# Patient Record
Sex: Male | Born: 1990 | Race: Black or African American | Hispanic: No | Marital: Single | Smoking: Former smoker
Health system: Southern US, Community
[De-identification: ages and names within clinical notes are randomized; demographics above are authoritative.]

## PROBLEM LIST (undated history)

## (undated) DIAGNOSIS — J45909 Unspecified asthma, uncomplicated: Secondary | ICD-10-CM

## (undated) HISTORY — PX: ANKLE SURGERY: SHX546

---
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
## 2010-09-28 NOTE — Op Note (Signed)
NAME:  Christopher Hicks, Christopher Hicks                    ACCOUNT NO.:  [id number]   MEDICAL RECORD NO.:  [id number]                   PATIENT TYPE:  OIB   LOCATION:  6123                                 FACILITY:  [facility]   PHYSICIAN:  Dionne Ano. Everlene Other, M.D.         DATE OF BIRTH:  [birth year]   DATE OF PROCEDURE:  03/09/2002  DATE OF DISCHARGE:                                 OPERATIVE REPORT   PREOPERATIVE DIAGNOSIS:  Malunion, right radius with pain and deformity.   POSTOPERATIVE DIAGNOSIS:  Malunion, right radius with pain and deformity.   PROCEDURE:  1. Corrective osteotomy, right radius with autologous bone grafting and     dorsal plate fixation.  2. Decompression/incision of  the extensor pollicis longus tendon sheath     (third dorsal compartment tendon sheath incision).  3. Stress radiography.   SURGEON:  Dionne Ano. Amanda Pea, M.D.   ASSISTANT:  Karie Chimera, PA-C   COMPLICATIONS:  None.   ANESTHESIA:  General LMA  anesthetic.   TOURNIQUET TIME:  Less than two hours.   DRAINS:  One #7 TLS was placed.   INDICATIONS FOR PROCEDURE:  This patient is  a nearly 20 year old black male  who presents with a malunion of his radius. The patient has been counseled  with regards to the risks and benefits of the surgery including the risks of  infection, bleeding, anesthesia, damage to normal structures and  failure of  the surgery to accomplish the intended goals of relieving symptoms and  restoring function. With this in mind, he and his family (mother) desired to  proceed. I have discussed with them all questions.   DESCRIPTION OF PROCEDURE:  The patient was seen by myself and anesthesia. He  was  taken to the operative suite and underwent antibiotic prophylaxis in  the form of Ancef. He was laid supine and appropriately padded and underwent  general LMA anesthetic. Once this was done the right upper extremity was  prepped and draped with Betadine scrub and paint.   After the  sterile field was secured and the arm was elevated, and incision  was made dorsoradially. Previous to this stress radiography was performed  revealing that the fracture had totally healed and that the patient had a  significant deformity.   Once this was noted I then incised the skin. The subcutaneous tissue was  dissected sharply down to an interval between the second and third dorsal  compartments. The outcropper muscles were identified as was the ECRB and  ECRL and EPL and EBC sheath. The PL tendon sheath was decompressed, as it  was stuck in scar tissue and highly tight.   After decompression, I then created an interval between the  third and  second dorsal compartments. The bone was circumferentially dissected, taking  care to not encroach upon the interosseous membrane. With this performed and  metacarpal retractors placed around the site of the deformity,  I then  performed a takedown of the  malunion with osteotome curet and other sharp  instruments performed delicately.   The prior malunion site was taken down appropriately, and following this,  the prior fracture site was identified and re-released. Once this was done I  placed it in the proper position utilizing K-wires for guides. Once this was  done provisional fixation was held and autologous bone grafting was  accomplished via the takedown bone graft which was present and readily  apparent.   Once the bone grafting was accomplished and provisional fixation secured, I  then applied a Synthes 2.0 blade plate. This was done proximally and  distally with excellent fixation, taking care to avoid the growth plate.  This was done under radiographic  imaging without difficulty.   Following plate application, I  then performed stress radiography revealing  excellent pronation and supination, no instability and no problems. I was  pleased with the screw lengths and all parameters.   Once this was done the patient had additional  bone grafting placed followed  by  closure of the periosteum with Vicryl suture, followed by tourniquet  deflation, obtaining hemostasis and then closing the wound in layers with 2-  0 Vicryl and 4-0 Prolene. The patient had soft compartments  and no  complications. Marcaine was placed in the wound for postoperative analgesia,  and the patient then had a sterile dressing and a long arm splint and  supination placed.   He was awakened from anesthesia and transferred to the recovery room in  stable condition. Sponge and needle counts were quoted to be correct. The  patient tolerated the procedure well and there were no complications. We  will monitor his condition closely and  keep him overnight for observation  and proceed accordingly based upon postoperative protocol. We have gone all  dos and don'ts with the mother as well. At the present time he is stable and  doing quite well. It has been a pleasure to participate in his care and I  look  forward in participating in his operative recovery.                                               Dionne Ano. Everlene Other, M.D.    Nash Mantis  D:  03/09/2002  T:  03/10/2002  Job:  [id number]   cc:   Leonides Grills, MD  [address]  [place]  [place] [address]  Fax: [phone]

## 2010-09-28 NOTE — Op Note (Signed)
NAME:  MIMSChayton, Murata               ACCOUNT NO.:  [id number]   MEDICAL RECORD NO.:  [id number]          PATIENT TYPE:  INP   LOCATION:  A303                          FACILITY:  [facility]   PHYSICIAN:  Dirk Dress. Katrinka Blazing, M.D.   DATE OF BIRTH:  May 03, 1991   DATE OF PROCEDURE:  07/29/2005  DATE OF DISCHARGE:                                 OPERATIVE REPORT   PREOPERATIVE DIAGNOSIS:  Acute appendicitis.   POSTOPERATIVE DIAGNOSIS:  Acute appendicitis.   PROCEDURE:  Laparoscopic appendectomy.   SURGEON:  Dr. Katrinka Blazing.   DESCRIPTION:  Under general anesthesia, the patient's abdomen was prepped  and draped in a sterile field. Supraumbilical incision was made. Verres  needle was inserted uneventfully. Abdomen was insufflated with 2 liters of  CO2. Using a Visiport guide, a 10-mm port was placed. Laparoscope was  placed, and the appendix was identified. Under videoscopic guidance, a 5-mm  port was placed in the suprapubic midline, and a 12-mm port was placed in  the left lower quadrant. The appendix was adherent to the anterolateral  abdominal wall. Using blunt dissection, the small bowel and cecum were  dissected from the appendix. The appendix was then dissected from the  abdominal wall. The base was identified. Mesoappendix was dissected, clipped  with multiple clips and divided. The base of the appendix was transected  using a standard endo GIA stapler. Pictures of the staple were obtained.  There was no evidence of bleeding. The appendix was placed in an EndoCatch  device and retrieved. Irrigation was carried out until the fluid remained  clear. Because of the purulence that was present, a JP drain was placed and  brought out through the suprapubic midline incision. It was placed in the  right gutter in the pelvis. Further irrigation was carried out. CO2 was  allowed to escape from the abdomen, and the ports were removed. The  incisions were closed using 0 Vicryl on the fascia of the  supraumbilical  incision and the left lower quadrant incision. Skin was closed with staples.  Drain was secured with 3-0 nylon. The patient tolerated the procedure well.  Dressings were placed. She was awakened from anesthesia uneventfully,  transferred to a bed and taken to the post anesthetic care unit for  monitoring.      Dirk Dress. Katrinka Blazing, M.D.  Electronically Signed     LCS/MEDQ  D:  07/29/2005  T:  07/30/2005  Job:  [id number]   cc:   Mila Homer. Sudie Bailey, M.D.  Fax: [phone]

## 2010-09-28 NOTE — H&P (Signed)
NAME:  Christopher Hicks, Christopher Hicks               ACCOUNT NO.:  [id number]   MEDICAL RECORD NO.:  [id number]          PATIENT TYPE:  INP   LOCATION:  A303                          FACILITY:  [facility]   PHYSICIAN:  Dirk Dress. Katrinka Blazing, M.D.   DATE OF BIRTH:  04/24/1991   DATE OF ADMISSION:  07/29/2005  DATE OF DISCHARGE:  LH                                HISTORY & PHYSICAL   A 20 year old male child with a history of acute onset of pain in his lower  abdomen with nausea starting on Thursday. He was seen in his private  physician's office on Friday, was told that he possibly had appendicitis. He  had some labs drawn and was given Phenergan. The patient was had worsening  pain all day Saturday and all of Sunday. He finally came to the emergency  room on the early morning of March 18. He was seen in the emergency room. He  was noted to have an acute abdomen, fever, leukocytes and CT which showed  acute appendicitis. The patient is admitted and will have appendectomy.   PAST HISTORY:  He has no chronic illnesses. He has no medication. He has no  allergies. His only surgery was open treatment with internal fixation of his  right forearm secondary to a fracture which was done two years ago.   SOCIAL HISTORY:  He is a ninth grader that does not drink, smoke or use  drugs.   FAMILY HISTORY:  Positive for diabetes, hypertension and asthma.   PHYSICAL EXAMINATION:  GENERAL:  The patient appears to be in no acute  distress. He is lying quietly in bed but does have pain when he moves.  VITAL SIGNS:  Blood pressure 127/84, pulse 106, respirations 28, temperature  100 degrees, maximum temperature 101.1. Weight 114.4 pounds.  HEENT:  Unremarkable.  NECK:  Supple. No JVD, bruit, adenopathy or thyromegaly.  CHEST:  Clear to auscultation anteriorly and posteriorly. No rales, rhonchi,  rubs or wheezes.  HEART:  Regular rate and rhythm without murmur, gallop or rub.  ABDOMEN:  Distended, active bowel sounds. Moderate  tenderness in hypogastric  and right lower quadrant with guarding. No masses.  EXTREMITIES:  No clubbing, cyanosis, or edema. Full pulses. No joint  deformity.   LABORATORY DATA:  White count 14,600, hemoglobin 15.4, hematocrit 45.7, BUN  11, creatinine 1, potassium 4.1. CT compatible with appendicitis and  periappendiceal inflammation.   IMPRESSION:  Acute appendicitis.   PLAN:  Laparoscopic appendectomy.      Dirk Dress. Katrinka Blazing, M.D.  Electronically Signed     LCS/MEDQ  D:  07/29/2005  T:  07/29/2005  Job:  [id number]

## 2010-09-28 NOTE — Discharge Summary (Signed)
NAME:  Christopher Hicks, Christopher Hicks               ACCOUNT NO.:  [id number]   MEDICAL RECORD NO.:  [id number]          PATIENT TYPE:  INP   LOCATION:  A303                          FACILITY:  [facility]   PHYSICIAN:  Dirk Dress. Katrinka Blazing, M.D.   DATE OF BIRTH:  1991/04/22   DATE OF ADMISSION:  07/29/2005  DATE OF DISCHARGE:  03/22/2007LH                                 DISCHARGE SUMMARY   DISCHARGE DIAGNOSIS:  Acute appendicitis.   PROCEDURE:  Laparoscopic appendectomy July 29, 2005.   DISPOSITION:  The patient discharged home in stable satisfactory condition.   DISCHARGE MEDICATIONS:  1.  Omeprazole 20 mg daily.  2.  Keflex 500 mg four times daily.  3.  Darvocet N 100 one half to one tablet q.4-6h. as needed for pain.  4.  Carafate liquid 1 g before meals and at bedtime.   The patient is scheduled to be seen in the office in one week.  Family  members were instructed as to the care of his JP drain.   HISTORY:  A 20 year old male child with a history of acute onset of pain in  his lower abdomen with nausea starting on the day prior to admission.  He  was seen in his physician's office and was sent for evaluation.  He  continued to have pain over the next three days.  He was finally seen in the  emergency room on July 28, 2005.  He had evidence of acute abdomen with  fever and leukocytosis.  CT of the abdomen showed acute appendicitis.  The  patient was admitted for appendectomy.   PAST HISTORY:  Unremarkable.   Examination was unremarkable except for oral temperature of 101.1.  His  abdomen was distended.  He had moderate tenderness in the hypogastrium and  right lower quadrant with associated guarding.  White count was 14,600.  CT  was compatible with appendicitis and periappendiceal inflammation.  The  patient was prepared for appendectomy and underwent appendectomy on July 29, 2005, using the laparoscopic technique.  He had some emesis on the first  postoperative day but otherwise gradually  improved.  Diet was advanced on  the second  postoperative day.  White count returned to normal.  By the evening of August 01, 2005, he was feeling better and was tolerating a regular diet.  He was  afebrile.  Abdomen was benign. JP drainage was serous.  White count was  8000.  He was discharged home in satisfactory condition.      Dirk Dress. Katrinka Blazing, M.D.  Electronically Signed     LCS/MEDQ  D:  09/29/2005  T:  09/30/2005  Job:  [id number]   cc:   Mila Homer. Sudie Bailey, M.D.  Fax: [phone]

## 2011-06-09 ENCOUNTER — Encounter (HOSPITAL_COMMUNITY): Payer: Self-pay | Admitting: *Deleted

## 2011-06-09 ENCOUNTER — Emergency Department (INDEPENDENT_AMBULATORY_CARE_PROVIDER_SITE_OTHER)
Admission: EM | Admit: 2011-06-09 | Discharge: 2011-06-09 | Disposition: A | Discharge status: Home / Self Care | Payer: Self-pay | Source: Home / Self Care

## 2011-06-09 DIAGNOSIS — T148XXA Other injury of unspecified body region, initial encounter: Secondary | ICD-10-CM

## 2011-06-09 MED ORDER — IBUPROFEN 800 MG PO TABS: 800.0000 mg | ORAL_TABLET | Freq: Three times a day (TID) | ORAL | Status: AC | PRN

## 2011-06-09 MED ORDER — CYCLOBENZAPRINE HCL 5 MG PO TABS: 5.0000 mg | ORAL_TABLET | Freq: Three times a day (TID) | ORAL | Status: AC | PRN

## 2011-06-09 NOTE — ED Notes (Signed)
Per pt fell Friday night going down steps missed step slid down 7 steps - today going up the steps fell now with pain right great toe and mid back pain - pain  worse with movement

## 2011-06-09 NOTE — ED Provider Notes (Signed)
History     CSN: [id number]  Arrival date & time 06/09/11  1449   None     Chief Complaint  Patient presents with  . Fall  . Back Pain  . Toe Pain    (Consider location/radiation/quality/duration/timing/severity/associated sxs/prior treatment) HPI Comments: Pt describes slipping twice, once while going down stairs, once while going up stairs. States both times injured R great toe and fell on back.    Patient is a 21 y.o. male presenting with fall. The history is provided by the patient.  Fall The accident occurred 12 to 24 hours ago (also had a fall 2 days ago). Incident: 1st fall while going down stairs, 2nd fall going up stairs. Point of impact: back. Pain location: back and R great toe. The pain is at a severity of 4/10. The pain is moderate. He was ambulatory at the scene. There was no drug use involved in the accident. There was no alcohol use involved in the accident. Pertinent negatives include no fever, no numbness, no abdominal pain, no vomiting, no headaches and no loss of consciousness.    History reviewed. No pertinent past medical history.  Past Surgical History  Procedure Date  . Appendectomy   . Arm surgery     History reviewed. No pertinent family history.  History  Substance Use Topics  . Smoking status: Current Everyday Smoker  . Smokeless tobacco: Not on file  . Alcohol Use: Yes      Review of Systems  Constitutional: Negative for fever and chills.  Gastrointestinal: Negative for vomiting and abdominal pain.  Musculoskeletal: Positive for back pain.       Toe pain  Neurological: Negative for dizziness, loss of consciousness, weakness, numbness and headaches.    Allergies  Review of patient's allergies indicates no known allergies.  Home Medications   Current Outpatient Rx  Name Route Sig Dispense Refill  . ACETAMINOPHEN 325 MG PO TABS Oral Take 650 mg by mouth every 6 (six) hours as needed.    . CYCLOBENZAPRINE HCL 5 MG PO TABS Oral Take 1  tablet (5 mg total) by mouth 3 (three) times daily as needed for muscle spasms. 21 tablet 0  . IBUPROFEN 800 MG PO TABS Oral Take 1 tablet (800 mg total) by mouth 3 (three) times daily with meals as needed for pain. 21 tablet 0    BP 138/80  Pulse 68  Temp(Src) 98.4 F (36.9 C) (Oral)  Resp 20  SpO2 100%  Physical Exam  Constitutional: He appears well-developed and well-nourished. He appears distressed.  HENT:  Head: Normocephalic and atraumatic.  Pulmonary/Chest: Effort normal.  Musculoskeletal:       Lumbar back: He exhibits tenderness. He exhibits no swelling, no edema and no deformity.       Back:       Right foot: He exhibits tenderness. He exhibits normal range of motion and no swelling.       Feet:  Neurological: He has normal strength. No sensory deficit.  Skin: Skin is warm and dry. No abrasion and no bruising noted. No erythema.    ED Course  Procedures (including critical care time)  Labs Reviewed - No data to display No results found.   1. Contusion   2. Muscle strain       MDM          Cathlyn Parsons, NP 06/09/11 1713  Cathlyn Parsons, NP 06/09/11 1724

## 2011-06-10 NOTE — ED Provider Notes (Signed)
Medical screening examination/treatment/procedure(s) were performed by non-physician practitioner and as supervising physician I was immediately available for consultation/collaboration.  Tishawna Larouche M. MD   Icie Kuznicki M Mikia Delaluz, MD 06/10/11 1031 

## 2011-09-26 ENCOUNTER — Emergency Department (HOSPITAL_COMMUNITY)
Admission: EM | Admit: 2011-09-26 | Discharge: 2011-09-26 | Disposition: A | Discharge status: Home / Self Care | Payer: Self-pay | Attending: Emergency Medicine | Admitting: Emergency Medicine

## 2011-09-26 ENCOUNTER — Encounter (HOSPITAL_COMMUNITY): Payer: Self-pay | Admitting: Emergency Medicine

## 2011-09-26 DIAGNOSIS — K2990 Gastroduodenitis, unspecified, without bleeding: Secondary | ICD-10-CM | POA: Insufficient documentation

## 2011-09-26 DIAGNOSIS — K920 Hematemesis: Secondary | ICD-10-CM | POA: Insufficient documentation

## 2011-09-26 DIAGNOSIS — K2970 Gastritis, unspecified, without bleeding: Secondary | ICD-10-CM | POA: Insufficient documentation

## 2011-09-26 DIAGNOSIS — F17200 Nicotine dependence, unspecified, uncomplicated: Secondary | ICD-10-CM | POA: Insufficient documentation

## 2011-09-26 LAB — DIFFERENTIAL
Basophils Absolute: 0 10*3/uL (ref 0.0–0.1)
Basophils Relative: 0 % (ref 0–1)
Eosinophils Absolute: 0.1 10*3/uL (ref 0.0–0.7)
Lymphocytes Relative: 8 % — ABNORMAL LOW (ref 12–46)
Lymphs Abs: 0.9 10*3/uL (ref 0.7–4.0)
Monocytes Relative: 6 % (ref 3–12)
Neutro Abs: 9.4 10*3/uL — ABNORMAL HIGH (ref 1.7–7.7)
Neutrophils Relative %: 86 % — ABNORMAL HIGH (ref 43–77)

## 2011-09-26 LAB — COMPREHENSIVE METABOLIC PANEL
ALT: 13 U/L (ref 0–53)
AST: 42 U/L — ABNORMAL HIGH (ref 0–37)
Calcium: 9.8 mg/dL (ref 8.4–10.5)
Creatinine, Ser: 1.5 mg/dL — ABNORMAL HIGH (ref 0.50–1.35)
GFR calc Af Amer: 76 mL/min — ABNORMAL LOW (ref >90)
GFR calc non Af Amer: 65 mL/min — ABNORMAL LOW (ref >90)
Sodium: 142 mEq/L (ref 135–145)
Total Protein: 7.4 g/dL (ref 6.0–8.3)

## 2011-09-26 LAB — OCCULT BLOOD, POC DEVICE: Fecal Occult Bld: NEGATIVE

## 2011-09-26 LAB — CBC
Hemoglobin: 16.9 g/dL (ref 13.0–17.0)
RBC: 5.38 MIL/uL (ref 4.22–5.81)

## 2011-09-26 MED ORDER — OMEPRAZOLE 20 MG PO CPDR: 20.0000 mg | DELAYED_RELEASE_CAPSULE | Freq: Every day | ORAL | Status: DC

## 2011-09-26 MED ORDER — GI COCKTAIL ~~LOC~~
30.0000 mL | Freq: Once | ORAL | Status: AC
  Administered 2011-09-26: 30 mL via ORAL
  Filled 2011-09-26: qty 30

## 2011-09-26 MED ORDER — ONDANSETRON HCL 4 MG/2ML IJ SOLN
4.0000 mg | Freq: Once | INTRAMUSCULAR | Status: AC
  Administered 2011-09-26: 4 mg via INTRAVENOUS
  Filled 2011-09-26: qty 2

## 2011-09-26 MED ORDER — PROMETHAZINE HCL 25 MG PO TABS: 25.0000 mg | ORAL_TABLET | Freq: Four times a day (QID) | ORAL | Status: DC | PRN

## 2011-09-26 MED ORDER — SUCRALFATE 1 G PO TABS: 1.0000 g | ORAL_TABLET | Freq: Four times a day (QID) | ORAL | Status: DC

## 2011-09-26 MED ORDER — PANTOPRAZOLE SODIUM 40 MG IV SOLR
40.0000 mg | Freq: Once | INTRAVENOUS | Status: AC
  Administered 2011-09-26: 40 mg via INTRAVENOUS
  Filled 2011-09-26: qty 40

## 2011-09-26 NOTE — ED Notes (Signed)
Pt reports vomiting large amounts of dark red blood around 0830 this morning.  No other episodes of emesis since this morning.   Pt reports no other symptoms.

## 2011-09-26 NOTE — Discharge Instructions (Signed)
Take prilosec (omeprazole) once a day for 14 days or until the pain has resolved.  Take carafate up to four times a day as needed.  If you can not afford this medication, You can try over the counter pepcid or  Tums/rolaids. Take promethazine for nausea.  Avoid alcohol, caffeine, orange juice, chocolate and anti-inflammatory medications such as ibuprofen, motrin, advil, aleve and aspirin.  You should return to the ER if you develop fever, worsening pain or uncontrolled vomiting.   You have been referred to a GI doctor if you have recurrent episodes of bloody vomit or notice blood in your stool.  Gastritis Gastritis is an inflammation (the body's way of reacting to injury and/or infection) of the stomach. It is often caused by viral or bacterial (germ) infections. It can also be caused by chemicals (including alcohol) and medications. This illness may be associated with generalized malaise (feeling tired, not well), cramps, and fever. The illness may last 2 to 7 days. If symptoms of gastritis continue, gastroscopy (looking into the stomach with a telescope-like instrument), biopsy (taking tissue samples), and/or blood tests may be necessary to determine the cause. Antibiotics will not affect the illness unless there is a bacterial infection present. One common bacterial cause of gastritis is an organism known as H. Pylori. This can be treated with antibiotics. Other forms of gastritis are caused by too much acid in the stomach. They can be treated with medications such as H2 blockers and antacids. Home treatment is usually all that is needed. Young children will quickly become dehydrated (loss of body fluids) if vomiting and diarrhea are both present. Medications may be given to control nausea. Medications are usually not given for diarrhea unless especially bothersome. Some medications slow the removal of the virus from the gastrointestinal tract. This slows down the healing process. HOME CARE INSTRUCTIONS Home  care instructions for nausea and vomiting:  For adults: drink small amounts of fluids often. Drink at least 2 quarts a day. Take sips frequently. Do not drink large amounts of fluid at one time. This may worsen the nausea.   Only take over-the-counter or prescription medicines for pain, discomfort, or fever as directed by your caregiver.   Drink clear liquids only. Those are anything you can see through such as water, broth, or soft drinks.   Once you are keeping clear liquids down, you may start full liquids, soups, juices, and ice cream or sherbet. Slowly add bland (plain, not spicy) foods to your diet.  Home care instructions for diarrhea:  Diarrhea can be caused by bacterial infections or a virus. Your condition should improve with time, rest, fluids, and/or anti-diarrheal medication.   Until your diarrhea is under control, you should drink clear liquids often in small amounts. Clear liquids include: water, broth, jell-o water and weak tea.  Avoid:  Milk.   Fruits.   Tobacco.   Alcohol.   Extremely hot or cold fluids.   Too much intake of anything at one time.  When your diarrhea stops you may add the following foods, which help the stool to become more formed:  Rice.   Bananas.   Apples without skin.   Dry toast.  Once these foods are tolerated you may add low-fat yogurt and low-fat cottage cheese. They will help to restore the normal bacterial balance in your bowel. Wash your hands well to avoid spreading bacteria (germ) or virus. SEEK IMMEDIATE MEDICAL CARE IF:   You are unable to keep fluids down.   Vomiting  or diarrhea become persistent (constant).   Abdominal pain develops, increases, or localizes. (Right sided pain can be appendicitis. Left sided pain in adults can be diverticulitis.)   You develop a fever (an oral temperature above 102 F (38.9 C)).   Diarrhea becomes excessive or contains blood or mucus.   You have excessive weakness, dizziness,  fainting or extreme thirst.   You are not improving or you are getting worse.   You have any other questions or concerns.  Document Released: 04/23/2001 Document Revised: 04/18/2011 Document Reviewed: 04/29/2005 [facility] Patient Information 2012 [place], [place].

## 2011-09-26 NOTE — ED Notes (Signed)
Pt states his pain is only present when he is laying down

## 2011-09-26 NOTE — ED Provider Notes (Signed)
History     CSN: [id number]  Arrival date & time 09/26/11  0906   First MD Initiated Contact with Patient 09/26/11 [phone]      Chief Complaint  Patient presents with  . Hematemesis    (Consider location/radiation/quality/duration/timing/severity/associated sxs/prior treatment) HPI History provided by pt.   Pt woke with a single episode of bright red hematemesis this morning.  Continues to feel nauseous.  Felt fine before going to bed last night and denies eating/drinking anything red.  Associated w/ mild pain across upper abdomen.  Denies fever, chest pain, diarrhea, hematochezia/melena and urinary sx.  Pt does not drink alcohol or take NSAIDs on a regular basis.  Did not drink any alcohol last night.  Has never been diagnosed w/ gastric ulcers.  Past abd surgeries include appendectomy.   History reviewed. No pertinent past medical history.  Past Surgical History  Procedure Date  . Appendectomy   . Arm surgery     History reviewed. No pertinent family history.  History  Substance Use Topics  . Smoking status: Current Everyday Smoker  . Smokeless tobacco: Not on file  . Alcohol Use: Yes      Review of Systems  All other systems reviewed and are negative.    Allergies  Review of patient's allergies indicates no known allergies.  Home Medications   Current Outpatient Rx  Name Route Sig Dispense Refill  . ACETAMINOPHEN 500 MG PO TABS Oral Take 1,500 mg by mouth every 6 (six) hours as needed. For headache      BP 137/93  Pulse 92  Temp(Src) 97.8 F (36.6 C) (Oral)  Resp 16  SpO2 98%  Physical Exam  Nursing note and vitals reviewed. Constitutional: He is oriented to person, place, and time. He appears well-developed and well-nourished. No distress.  HENT:  Head: Normocephalic and atraumatic.  Mouth/Throat: Oropharynx is clear and moist.  Eyes:       Normal appearance.  Pink conjunctiva.   Neck: Normal range of motion.  Cardiovascular: Normal rate and  regular rhythm.   Pulmonary/Chest: Effort normal and breath sounds normal. No respiratory distress.  Abdominal: Soft. Bowel sounds are normal. He exhibits no distension and no mass. There is no rebound and no guarding.       Diffuse mild tenderness of upper abdomen, particularly the epigastrium.   Genitourinary:       No CVA tenderness.  Nml rectal tone.  Nml stool color.  No gross blood.   Musculoskeletal: Normal range of motion.  Neurological: He is alert and oriented to person, place, and time.  Skin: Skin is warm and dry. No rash noted.  Psychiatric: He has a normal mood and affect. His behavior is normal.    ED Course  Procedures (including critical care time)  Labs Reviewed  CBC - Abnormal; Notable for the following:    WBC 10.9 (*)    MCHC 36.1 (*)    All other components within normal limits  DIFFERENTIAL - Abnormal; Notable for the following:    Neutrophils Relative 86 (*)    Neutro Abs 9.4 (*)    Lymphocytes Relative 8 (*)    All other components within normal limits  COMPREHENSIVE METABOLIC PANEL - Abnormal; Notable for the following:    Creatinine, Ser 1.50 (*)    AST 42 (*)    Total Bilirubin 1.3 (*)    GFR calc non Af Amer 65 (*)    GFR calc Af Amer 76 (*)    All other components  within normal limits  LIPASE, BLOOD - Abnormal; Notable for the following:    Lipase 10 (*)    All other components within normal limits  OCCULT BLOOD, POC DEVICE   No results found.   1. Gastritis   2. Hematemesis       MDM  Healthy 21yo M presents w/ single episode of hematemesis this morning.  On exam, well hydrated, no signs of anemia, diffuse, mild upper abd ttp, particularly in epigastrium, nml stool color.  Hemoccult neg and hgb w/in nml range.  Rest of labs unremarkable.  Results discussed w/ pt.  Pt received IV protonix and GI cocktail and reports that his abdominal pain is improved.  Has not had any further episodes of vomiting in ED.  VSS.  Suspect that he has  gastritis.  D/c'd home w/ prilosec, carafate and promethazine as well as referral to GI for persistent sx.  Return precautions discussed.         Arie Sabina McKee, [place] 09/26/11 1141

## 2011-09-26 NOTE — ED Provider Notes (Signed)
Medical screening examination/treatment/procedure(s) were performed by non-physician practitioner and as supervising physician I was immediately available for consultation/collaboration.  Shaia Porath L Rian Busche, MD 09/26/11 1625 

## 2013-04-18 ENCOUNTER — Emergency Department (HOSPITAL_COMMUNITY)
Admission: EM | Admit: 2013-04-18 | Discharge: 2013-04-18 | Disposition: A | Discharge status: Home / Self Care | Payer: Self-pay | Attending: Emergency Medicine | Admitting: Emergency Medicine

## 2013-04-18 ENCOUNTER — Emergency Department (HOSPITAL_COMMUNITY): Payer: Self-pay

## 2013-04-18 ENCOUNTER — Encounter (HOSPITAL_COMMUNITY): Payer: Self-pay | Admitting: Emergency Medicine

## 2013-04-18 DIAGNOSIS — R5383 Other fatigue: Secondary | ICD-10-CM

## 2013-04-18 DIAGNOSIS — IMO0001 Reserved for inherently not codable concepts without codable children: Secondary | ICD-10-CM | POA: Insufficient documentation

## 2013-04-18 DIAGNOSIS — F17200 Nicotine dependence, unspecified, uncomplicated: Secondary | ICD-10-CM | POA: Insufficient documentation

## 2013-04-18 DIAGNOSIS — R5381 Other malaise: Secondary | ICD-10-CM | POA: Insufficient documentation

## 2013-04-18 DIAGNOSIS — R509 Fever, unspecified: Secondary | ICD-10-CM | POA: Insufficient documentation

## 2013-04-18 DIAGNOSIS — J069 Acute upper respiratory infection, unspecified: Secondary | ICD-10-CM | POA: Insufficient documentation

## 2013-04-18 LAB — CBC
HCT: 40.7 % (ref 39.0–52.0)
MCV: 85.9 fL (ref 78.0–100.0)
Platelets: 215 10*3/uL (ref 150–400)
RBC: 4.74 MIL/uL (ref 4.22–5.81)
RDW: 12.1 % (ref 11.5–15.5)
WBC: 7.7 10*3/uL (ref 4.0–10.5)

## 2013-04-18 LAB — BASIC METABOLIC PANEL
CO2: 26 mEq/L (ref 19–32)
Chloride: 101 mEq/L (ref 96–112)
Creatinine, Ser: 1.42 mg/dL — ABNORMAL HIGH (ref 0.50–1.35)
GFR calc Af Amer: 80 mL/min — ABNORMAL LOW (ref >90)
Potassium: 3.8 mEq/L (ref 3.5–5.1)

## 2013-04-18 MED ORDER — IBUPROFEN 800 MG PO TABS
800.0000 mg | ORAL_TABLET | Freq: Once | ORAL | Status: AC
  Administered 2013-04-18: 800 mg via ORAL
  Filled 2013-04-18: qty 1

## 2013-04-18 MED ORDER — ONDANSETRON 4 MG PO TBDP
8.0000 mg | ORAL_TABLET | Freq: Once | ORAL | Status: AC
  Administered 2013-04-18: 8 mg via ORAL
  Filled 2013-04-18: qty 2

## 2013-04-18 NOTE — ED Provider Notes (Signed)
CSN: [id number]     Arrival date & time 04/18/13  1127 History   First MD Initiated Contact with Patient 04/18/13 1157     Chief Complaint  Patient presents with  . Fatigue  . Fever   (Consider location/radiation/quality/duration/timing/severity/associated sxs/prior Treatment) HPI Comments: Patient is a 22 year old male who presents today for fatigue and generalized myalgias. Patient states that symptoms began yesterday that worsened since this morning. He denies taking anything for his symptoms as well as any aggravating factors. Symptoms associated with sporadic chills, a congested nonproductive cough, nasal congestion and rhinorrhea. Patient concerned that he may have pneumonia as one of his relatives was sick with this. Patient denies associated fever, sore throat, drooling or inability to swallow, neck pain or stiffness, chest pain, shortness of breath, nausea, vomiting or diarrhea, abdominal pain, numbness/tingling, or extremity weakness.  The history is provided by the patient. No language interpreter was used.    History reviewed. No pertinent past medical history. Past Surgical History  Procedure Laterality Date  . Appendectomy    . Arm surgery     History reviewed. No pertinent family history. History  Substance Use Topics  . Smoking status: Current Every Day Smoker  . Smokeless tobacco: Not on file  . Alcohol Use: Yes    Review of Systems  Constitutional: Positive for fatigue.  HENT: Positive for congestion and rhinorrhea.   Respiratory: Positive for cough. Negative for shortness of breath.   Gastrointestinal: Negative for nausea, vomiting and diarrhea.  Musculoskeletal: Positive for myalgias.  All other systems reviewed and are negative.    Allergies  Review of patient's allergies indicates no known allergies.  Home Medications   Current Outpatient Rx  Name  Route  Sig  Dispense  Refill  . acetaminophen (TYLENOL) 500 MG tablet   Oral   Take 1,500 mg by mouth  every 6 (six) hours as needed. For headache          BP 130/80  Pulse 90  Temp(Src) 99.8 F (37.7 C) (Oral)  Resp 16  Wt 155 lb 6 oz (70.478 kg)  SpO2 100%  Physical Exam  Nursing note and vitals reviewed. Constitutional: He is oriented to person, place, and time. He appears well-developed and well-nourished. No distress.  Patient well and nontoxic appearing  HENT:  Head: Normocephalic and atraumatic.  Mouth/Throat: Oropharynx is clear and moist. No oropharyngeal exudate.  Eyes: Conjunctivae and EOM are normal. Pupils are equal, round, and reactive to light. No scleral icterus.  Neck: Normal range of motion. Neck supple.  No nuchal rigidity or meningismus  Cardiovascular: Normal rate, regular rhythm, normal heart sounds and intact distal pulses.   Pulmonary/Chest: Effort normal and breath sounds normal. No respiratory distress. He has no wheezes. He has no rales.  No retractions or accessory muscle use; symmetric chest expansion.  Abdominal: Soft. He exhibits no distension. There is no tenderness. There is no rebound and no guarding.  Musculoskeletal: Normal range of motion.  Neurological: He is alert and oriented to person, place, and time.  Skin: Skin is warm and dry. No rash noted. He is not diaphoretic. No erythema. No pallor.  Psychiatric: He has a normal mood and affect. His behavior is normal.    ED Course  Procedures (including critical care time) Labs Review Labs Reviewed  CBC - Abnormal; Notable for the following:    MCHC 36.9 (*)    All other components within normal limits  BASIC METABOLIC PANEL - Abnormal; Notable for the following:  Creatinine, Ser 1.42 (*)    GFR calc non Af Amer 69 (*)    GFR calc Af Amer 80 (*)    All other components within normal limits   Imaging Review Dg Chest 2 View  04/18/2013   CLINICAL DATA:  Low-grade fever, congestion  EXAM: CHEST  2 VIEW  COMPARISON:  None.  FINDINGS: Cardiomediastinal silhouette is unremarkable. No acute  infiltrate or pleural effusion. No pulmonary edema. Bony thorax is unremarkable.  IMPRESSION: No active cardiopulmonary disease.   Electronically Signed   By: Natasha Mead M.D.   On: 04/18/2013 13:14    EKG Interpretation   None       MDM   1. Viral URI with cough   2. Fatigue    22 year old otherwise healthy male presents for diffuse myalgias, nasal congestion, rhinorrhea, and congested nonproductive cough x2 days. Patient well and nontoxic appearing, hemodynamically stable, and afebrile. Patient has been without tachycardia, tachypnea, dyspnea, or hypoxia throughout ED course. Heart RRR and lungs CTAB. No retractions or accessory muscle use appreciated. Abdomen nontender to palpation. Labs today consistent with priors and chest x-ray negative for pneumonia, bronchitis, or other acute cardiopulmonary process. Symptoms consistent with viral upper respiratory infection with cough. Patient stable and appropriate for discharge with primary care followup. Advised ibuprofen Mucinex D for symptomatic management. Return precautions provided and patient agreeable to plan with no unaddressed concerns.   Filed Vitals:   04/18/13 1215 04/18/13 1230 04/18/13 1325 04/18/13 1341  BP:  129/78 136/90 130/80  Pulse: 83 82 87 90  Temp:      TempSrc:      Resp:    16  Weight:      SpO2: 100% 100% 97% 100%      Antony Madura, PA-C 04/18/13 1352

## 2013-04-18 NOTE — ED Notes (Signed)
Pt reports having fatigue and generalized weakness that started yesterday, more severe today with chills and productive cough. Denies n/v/d, no acute distress noted.

## 2013-04-18 NOTE — ED Notes (Signed)
Pt comfortable with d/c and f/u instructions. No prescriptions 

## 2013-04-19 NOTE — ED Provider Notes (Signed)
Medical screening examination/treatment/procedure(s) were performed by non-physician practitioner and as supervising physician I was immediately available for consultation/collaboration.  EKG Interpretation   None        Juliet Rude. Rubin Payor, MD 04/19/13 1944

## 2014-02-20 ENCOUNTER — Emergency Department (HOSPITAL_COMMUNITY)
Admission: EM | Admit: 2014-02-20 | Discharge: 2014-02-20 | Disposition: A | Discharge status: Home / Self Care | Payer: Self-pay | Attending: Emergency Medicine | Admitting: Emergency Medicine

## 2014-02-20 ENCOUNTER — Encounter (HOSPITAL_COMMUNITY): Payer: Self-pay | Admitting: Emergency Medicine

## 2014-02-20 DIAGNOSIS — Y9389 Activity, other specified: Secondary | ICD-10-CM | POA: Insufficient documentation

## 2014-02-20 DIAGNOSIS — S032XXA Dislocation of tooth, initial encounter: Secondary | ICD-10-CM | POA: Insufficient documentation

## 2014-02-20 DIAGNOSIS — X58XXXA Exposure to other specified factors, initial encounter: Secondary | ICD-10-CM | POA: Insufficient documentation

## 2014-02-20 DIAGNOSIS — Z87891 Personal history of nicotine dependence: Secondary | ICD-10-CM | POA: Insufficient documentation

## 2014-02-20 DIAGNOSIS — Y9289 Other specified places as the place of occurrence of the external cause: Secondary | ICD-10-CM | POA: Insufficient documentation

## 2014-02-20 MED ORDER — HYDROCODONE-ACETAMINOPHEN 5-325 MG PO TABS: 1.0000 | ORAL_TABLET | Freq: Four times a day (QID) | ORAL | Status: DC | PRN

## 2014-02-20 MED ORDER — IBUPROFEN 800 MG PO TABS: 800.0000 mg | ORAL_TABLET | Freq: Three times a day (TID) | ORAL | Status: DC | PRN

## 2014-02-20 MED ORDER — PENICILLIN V POTASSIUM 500 MG PO TABS: 500.0000 mg | ORAL_TABLET | Freq: Four times a day (QID) | ORAL | Status: DC

## 2014-02-20 NOTE — Discharge Instructions (Signed)
Return here as needed. Follow up with the dentist provided. °

## 2014-02-20 NOTE — ED Provider Notes (Signed)
CSN: [id number]     Arrival date & time 02/20/14  1733 History  This chart was scribed for non-physician practitioner, Ebbie Ridgehris Katrin Grabel, PA-C working with Raeford RazorStephen Kohut, MD by Greggory StallionKayla Andersen, ED scribe. This patient was seen in room TR05C/TR05C and the patient's care was started at 6:56 PM.   Chief Complaint  Patient presents with  . Dental Pain   The history is provided by the patient. No language interpreter was used.   HPI Comments: Christopher Hicks is a 23 y.o. male who presents to the Emergency Department complaining of gradual onset left lower dental pain that started earlier today. States part of his tooth broke off 2 days ago while he was eating. Reports having a filling on that tooth previously.   History reviewed. No pertinent past medical history. Past Surgical History  Procedure Laterality Date  . Appendectomy    . Arm surgery     No family history on file. History  Substance Use Topics  . Smoking status: Former Games developermoker  . Smokeless tobacco: Not on file  . Alcohol Use: Yes    Review of Systems All other systems negative except as documented in the HPI. All pertinent positives and negatives as reviewed in the HPI.  Allergies  Review of patient's allergies indicates no known allergies.  Home Medications   Prior to Admission medications   Medication Sig Start Date End Date Taking? Authorizing Provider  acetaminophen (TYLENOL) 500 MG tablet Take 1,500 mg by mouth every 6 (six) hours as needed. For headache    Historical Provider, MD   BP 127/78  Pulse 61  Temp(Src) 98.3 F (36.8 C) (Oral)  Resp 18  Ht 5\' 6"  (1.676 m)  Wt 147 lb (66.679 kg)  BMI 23.74 kg/m2  SpO2 97%  Physical Exam  Nursing note and vitals reviewed. Constitutional: He is oriented to person, place, and time. He appears well-developed and well-nourished. No distress.  HENT:  Head: Normocephalic and atraumatic.  Left lower second molar broken off right at gumline. No abscess.   Eyes: Conjunctivae  and EOM are normal.  Neck: Neck supple. No tracheal deviation present.  Cardiovascular: Normal rate.   Pulmonary/Chest: Effort normal. No respiratory distress.  Musculoskeletal: Normal range of motion.  Neurological: He is alert and oriented to person, place, and time.  Skin: Skin is warm and dry.  Psychiatric: He has a normal mood and affect. His behavior is normal.    ED Course  Procedures (including critical care time)  DIAGNOSTIC STUDIES: Oxygen Saturation is 97% on RA, normal by my interpretation.    COORDINATION OF CARE: 6:57 PM-Discussed treatment plan which includes an antibiotic and pain medication with pt at bedside and pt agreed to plan. Will give pt dental referrals and advised him to follow up.    I personally performed the services described in this documentation, which was scribed in my presence. The recorded information has been reviewed and is accurate.  Carlyle Dollyhristopher W Ayomide Purdy, PA-C 02/20/14 1902

## 2014-02-20 NOTE — ED Notes (Signed)
Pt c/o left lower toothache onset today. Pt reports while he was eating the other day part of his tooth broke off. Pt had a filling on that tooth.

## 2014-02-23 NOTE — ED Provider Notes (Signed)
Medical screening examination/treatment/procedure(s) were performed by non-physician practitioner and as supervising physician I was immediately available for consultation/collaboration.   EKG Interpretation None       Rogena Deupree, MD 02/23/14 1902 

## 2016-01-06 ENCOUNTER — Emergency Department (HOSPITAL_COMMUNITY)
Admission: EM | Admit: 2016-01-06 | Discharge: 2016-01-06 | Disposition: A | Discharge status: Home / Self Care | Payer: Self-pay | Attending: Emergency Medicine | Admitting: Emergency Medicine

## 2016-01-06 ENCOUNTER — Encounter (HOSPITAL_COMMUNITY): Payer: Self-pay | Admitting: Emergency Medicine

## 2016-01-06 ENCOUNTER — Emergency Department (HOSPITAL_COMMUNITY): Payer: Self-pay

## 2016-01-06 DIAGNOSIS — R0789 Other chest pain: Secondary | ICD-10-CM

## 2016-01-06 DIAGNOSIS — Z87891 Personal history of nicotine dependence: Secondary | ICD-10-CM | POA: Insufficient documentation

## 2016-01-06 DIAGNOSIS — S20212A Contusion of left front wall of thorax, initial encounter: Secondary | ICD-10-CM

## 2016-01-06 DIAGNOSIS — S2020XA Contusion of thorax, unspecified, initial encounter: Secondary | ICD-10-CM | POA: Insufficient documentation

## 2016-01-06 DIAGNOSIS — Y929 Unspecified place or not applicable: Secondary | ICD-10-CM | POA: Insufficient documentation

## 2016-01-06 DIAGNOSIS — Y939 Activity, unspecified: Secondary | ICD-10-CM | POA: Insufficient documentation

## 2016-01-06 DIAGNOSIS — Y999 Unspecified external cause status: Secondary | ICD-10-CM | POA: Insufficient documentation

## 2016-01-06 MED ORDER — IBUPROFEN 400 MG PO TABS
600.0000 mg | ORAL_TABLET | Freq: Once | ORAL | Status: AC
  Administered 2016-01-06: 600 mg via ORAL
  Filled 2016-01-06: qty 1

## 2016-01-06 MED ORDER — IBUPROFEN 600 MG PO TABS: 600.0000 mg | ORAL_TABLET | Freq: Four times a day (QID) | ORAL | 0 refills | Status: DC | PRN

## 2016-01-06 NOTE — ED Notes (Signed)
Pt is in stable condition upon d/c and ambulates from ED. 
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
## 2016-01-25 ENCOUNTER — Encounter: Payer: Self-pay | Admitting: Family Medicine

## 2016-01-25 ENCOUNTER — Ambulatory Visit: Payer: Self-pay | Attending: Family Medicine | Admitting: Family Medicine

## 2016-01-25 VITALS — BP 131/92 | HR 88 | Temp 97.5°F | Ht 66.0 in | Wt 151.8 lb

## 2016-01-25 DIAGNOSIS — Z23 Encounter for immunization: Secondary | ICD-10-CM

## 2016-01-25 DIAGNOSIS — F17200 Nicotine dependence, unspecified, uncomplicated: Secondary | ICD-10-CM | POA: Insufficient documentation

## 2016-01-25 DIAGNOSIS — Z720 Tobacco use: Secondary | ICD-10-CM

## 2016-01-25 DIAGNOSIS — M722 Plantar fascial fibromatosis: Secondary | ICD-10-CM | POA: Insufficient documentation

## 2016-01-25 DIAGNOSIS — Z9889 Other specified postprocedural states: Secondary | ICD-10-CM | POA: Insufficient documentation

## 2016-01-25 DIAGNOSIS — Z114 Encounter for screening for human immunodeficiency virus [HIV]: Secondary | ICD-10-CM | POA: Insufficient documentation

## 2016-01-25 MED ORDER — NAPROXEN 500 MG PO TABS: 500.0000 mg | ORAL_TABLET | Freq: Two times a day (BID) | ORAL | 0 refills | Status: DC

## 2016-01-25 NOTE — Assessment & Plan Note (Signed)
A: suspect b/l plantar fasciitis  P: NSAID Ice Home PT Support heels Plan for podiatry if no improvement with above

## 2016-01-25 NOTE — Progress Notes (Signed)
LOGO@  Subjective:  Patient ID: Christopher Hicks, male    DOB: [birth year]  Age: 25 y.o. MRN: [id number]  CC: Establish Care   HPI Christopher Hicks presents for   1. Foot pain: b/l foot pain for past 9 months. Has to limp in the morning. Bottom of foot pain. No known recent  Injury. Thinks he had a piece of glass in R ankle. Possible foreign body in L foot as a child age [age].  He also broke No swelling. He has worse pain first thing in AM when he stands. He also has pain during cold and rainy weather. Also has pain at the end of a long work day.   2. Smoking: has smoked off and on since age 27. Have quit in the past. No cough, CP or shortness of breath.   No past medical history on file.  Past Surgical History:  Procedure Laterality Date  . APPENDECTOMY    . arm surgery      No family history on file.  Social History  Substance Use Topics  . Smoking status: Current Every Day Smoker    Types: Cigarettes  . Smokeless tobacco: Never Used  . Alcohol use Yes    ROS Review of Systems  Constitutional: Negative for chills, fatigue, fever and unexpected weight change.  Eyes: Negative for visual disturbance.  Respiratory: Negative for cough and shortness of breath.   Cardiovascular: Negative for chest pain, palpitations and leg swelling.  Gastrointestinal: Negative for abdominal pain, blood in stool, constipation, diarrhea, nausea and vomiting.  Endocrine: Negative for polydipsia, polyphagia and polyuria.  Musculoskeletal: Negative for arthralgias, back pain, gait problem, myalgias and neck pain.  Skin: Negative for rash.  Allergic/Immunologic: Negative for immunocompromised state.  Hematological: Negative for adenopathy. Does not bruise/bleed easily.  Psychiatric/Behavioral: Negative for dysphoric mood, sleep disturbance and suicidal ideas. The patient is not nervous/anxious.     Objective:   Today's Vitals: BP (!) 131/92 (BP Location: Right Arm, Patient Position: Sitting, Cuff  Size: Small)   Pulse 88   Temp 97.5 F (36.4 C) (Oral)   Ht 5\' 6"  (1.676 m)   Wt 151 lb 12.8 oz (68.9 kg)   SpO2 99%   BMI 24.50 kg/m  BP Readings from Last 3 Encounters:  01/25/16 (!) 131/92  01/06/16 120/85  02/20/14 127/78    Physical Exam  Constitutional: He appears well-developed and well-nourished. No distress.  HENT:  Head: Normocephalic and atraumatic.  Neck: Normal range of motion. Neck supple.  Cardiovascular: Normal rate, regular rhythm, normal heart sounds and intact distal pulses.   Pulmonary/Chest: Effort normal and breath sounds normal.  Musculoskeletal: He exhibits no edema.       Right ankle: Normal.       Left ankle: Normal.       Right foot: Normal.       Left foot: Normal.  Not feet with b/l pes planus   Neurological: He is alert.  Skin: Skin is warm and dry. No rash noted. No erythema.  Psychiatric: He has a normal mood and affect.    Assessment & Plan:   Problem List Items Addressed This Visit      Unprioritized   Plantar fasciitis, bilateral   Relevant Medications   naproxen (NAPROSYN) 500 MG tablet    Other Visit Diagnoses    Screening for HIV (human immunodeficiency virus)    -  Primary   Relevant Orders   HIV antibody (with reflex)      Outpatient  Encounter Prescriptions as of 01/25/2016  Medication Sig  . acetaminophen (TYLENOL) 500 MG tablet Take 1,500 mg by mouth every 6 (six) hours as needed. For headache  . ibuprofen (ADVIL,MOTRIN) 600 MG tablet Take 1 tablet (600 mg total) by mouth every 6 (six) hours as needed.  . ranitidine (ZANTAC) 150 MG tablet Take 150 mg by mouth daily.  Marland Kitchen. HYDROcodone-acetaminophen (NORCO/VICODIN) 5-325 MG per tablet Take 1 tablet by mouth every 6 (six) hours as needed for moderate pain. (Patient not taking: Reported on 01/25/2016)  . ibuprofen (ADVIL,MOTRIN) 200 MG tablet Take 600 mg by mouth every 6 (six) hours as needed.  Marland Kitchen. ibuprofen (ADVIL,MOTRIN) 800 MG tablet Take 1 tablet (800 mg total) by mouth every  8 (eight) hours as needed. (Patient not taking: Reported on 01/25/2016)  . penicillin v potassium (VEETID) 500 MG tablet Take 1 tablet (500 mg total) by mouth 4 (four) times daily. (Patient not taking: Reported on 01/25/2016)   No facility-administered encounter medications on file as of 01/25/2016.     Follow-up: Return in about 6 weeks (around 03/07/2016) for foot pain .    Dessa PhiJosalyn Danzel Marszalek MD

## 2016-01-25 NOTE — Assessment & Plan Note (Signed)
Cessation addressed  

## 2016-01-25 NOTE — Patient Instructions (Addendum)
Christopher Hicks was seen today for establish care.  Diagnoses and all orders for this visit:  Screening for HIV (human immunodeficiency virus) -     HIV antibody (with reflex)  Plantar fasciitis, bilateral -     naproxen (NAPROSYN) 500 MG tablet; Take 1 tablet (500 mg total) by mouth 2 (two) times daily with a meal.  Current smoker   Smoking cessation support: smoking cessation hotline: 1-800-QUIT-NOW.  Smoking cessation classes are available through [facility]. Call ([phone] or visit our website at [email or web address].   ice feet for 15 minutes 2-3 times per day by doing the following  1. Freezing water in plastic bottle and rolling bottle under foot Or Placing an ice pack under under heel   F/u in 6 weeks for plantar fasciitis  Dr. Armen Pickup    Plantar Fasciitis With Rehab The plantar fascia is a fibrous, ligament-like, soft-tissue structure that spans the bottom of the foot. Plantar fasciitis, also called heel spur syndrome, is a condition that causes pain in the foot due to inflammation of the tissue. SYMPTOMS   Pain and tenderness on the underneath side of the foot.  Pain that worsens with standing or walking. CAUSES  Plantar fasciitis is caused by irritation and injury to the plantar fascia on the underneath side of the foot. Common mechanisms of injury include:  Direct trauma to bottom of the foot.  Damage to a small nerve that runs under the foot where the main fascia attaches to the heel bone.  Stress placed on the plantar fascia due to bone spurs. RISK INCREASES WITH:   Activities that place stress on the plantar fascia (running, jumping, pivoting, or cutting).  Poor strength and flexibility.  Improperly fitted shoes.  Tight calf muscles.  Flat feet.  Failure to warm-up properly before activity.  Obesity. PREVENTION  Warm up and stretch properly before activity.  Allow for adequate recovery between workouts.  Maintain  physical fitness:  Strength, flexibility, and endurance.  Cardiovascular fitness.  Maintain a health body weight.  Avoid stress on the plantar fascia.  Wear properly fitted shoes, including arch supports for individuals who have flat feet. PROGNOSIS  If treated properly, then the symptoms of plantar fasciitis usually resolve without surgery. However, occasionally surgery is necessary. RELATED COMPLICATIONS   Recurrent symptoms that may result in a chronic condition.  Problems of the lower back that are caused by compensating for the injury, such as limping.  Pain or weakness of the foot during push-off following surgery.  Chronic inflammation, scarring, and partial or complete fascia tear, occurring more often from repeated injections. TREATMENT  Treatment initially involves the use of ice and medication to help reduce pain and inflammation. The use of strengthening and stretching exercises may help reduce pain with activity, especially stretches of the Achilles tendon. These exercises may be performed at home or with a therapist. Your caregiver may recommend that you use heel cups of arch supports to help reduce stress on the plantar fascia. Occasionally, corticosteroid injections are given to reduce inflammation. If symptoms persist for greater than 6 months despite non-surgical (conservative), then surgery may be recommended.  MEDICATION   If pain medication is necessary, then nonsteroidal anti-inflammatory medications, such as aspirin and ibuprofen, or other minor pain relievers, such as acetaminophen, are often recommended.  Do not take pain medication within 7 days before surgery.  Prescription pain relievers may be given if deemed necessary by your caregiver. Use only as directed and only as much as  you need.  Corticosteroid injections may be given by your caregiver. These injections should be reserved for the most serious cases, because they may only be given a certain number  of times. HEAT AND COLD  Cold treatment (icing) relieves pain and reduces inflammation. Cold treatment should be applied for 10 to 15 minutes every 2 to 3 hours for inflammation and pain and immediately after any activity that aggravates your symptoms. Use ice packs or massage the area with a piece of ice (ice massage).  Heat treatment may be used prior to performing the stretching and strengthening activities prescribed by your caregiver, physical therapist, or athletic trainer. Use a heat pack or soak the injury in warm water. SEEK IMMEDIATE MEDICAL CARE IF:  Treatment seems to offer no benefit, or the condition worsens.  Any medications produce adverse side effects. EXERCISES RANGE OF MOTION (ROM) AND STRETCHING EXERCISES - Plantar Fasciitis (Heel Spur Syndrome) These exercises may help you when beginning to rehabilitate your injury. Your symptoms may resolve with or without further involvement from your physician, physical therapist or athletic trainer. While completing these exercises, remember:   Restoring tissue flexibility helps normal motion to return to the joints. This allows healthier, less painful movement and activity.  An effective stretch should be held for at least 30 seconds.  A stretch should never be painful. You should only feel a gentle lengthening or release in the stretched tissue. RANGE OF MOTION - Toe Extension, Flexion  Sit with your right / left leg crossed over your opposite knee.  Grasp your toes and gently pull them back toward the top of your foot. You should feel a stretch on the bottom of your toes and/or foot.  Hold this stretch for __________ seconds.  Now, gently pull your toes toward the bottom of your foot. You should feel a stretch on the top of your toes and or foot.  Hold this stretch for __________ seconds. Repeat __________ times. Complete this stretch __________ times per day.  RANGE OF MOTION - Ankle Dorsiflexion, Active Assisted  Remove  shoes and sit on a chair that is preferably not on a carpeted surface.  Place right / left foot under knee. Extend your opposite leg for support.  Keeping your heel down, slide your right / left foot back toward the chair until you feel a stretch at your ankle or calf. If you do not feel a stretch, slide your bottom forward to the edge of the chair, while still keeping your heel down.  Hold this stretch for __________ seconds. Repeat __________ times. Complete this stretch __________ times per day.  STRETCH - Gastroc, Standing  Place hands on wall.  Extend right / left leg, keeping the front knee somewhat bent.  Slightly point your toes inward on your back foot.  Keeping your right / left heel on the floor and your knee straight, shift your weight toward the wall, not allowing your back to arch.  You should feel a gentle stretch in the right / left calf. Hold this position for __________ seconds. Repeat __________ times. Complete this stretch __________ times per day. STRETCH - Soleus, Standing  Place hands on wall.  Extend right / left leg, keeping the other knee somewhat bent.  Slightly point your toes inward on your back foot.  Keep your right / left heel on the floor, bend your back knee, and slightly shift your weight over the back leg so that you feel a gentle stretch deep in your back calf.  Hold this position for __________ seconds. Repeat __________ times. Complete this stretch __________ times per day. STRETCH - Gastrocsoleus, Standing  Note: This exercise can place a lot of stress on your foot and ankle. Please complete this exercise only if specifically instructed by your caregiver.   Place the ball of your right / left foot on a step, keeping your other foot firmly on the same step.  Hold on to the wall or a rail for balance.  Slowly lift your other foot, allowing your body weight to press your heel down over the edge of the step.  You should feel a stretch in  your right / left calf.  Hold this position for __________ seconds.  Repeat this exercise with a slight bend in your right / left knee. Repeat __________ times. Complete this stretch __________ times per day.  STRENGTHENING EXERCISES - Plantar Fasciitis (Heel Spur Syndrome)  These exercises may help you when beginning to rehabilitate your injury. They may resolve your symptoms with or without further involvement from your physician, physical therapist or athletic trainer. While completing these exercises, remember:   Muscles can gain both the endurance and the strength needed for everyday activities through controlled exercises.  Complete these exercises as instructed by your physician, physical therapist or athletic trainer. Progress the resistance and repetitions only as guided. STRENGTH - Towel Curls  Sit in a chair positioned on a non-carpeted surface.  Place your foot on a towel, keeping your heel on the floor.  Pull the towel toward your heel by only curling your toes. Keep your heel on the floor.  If instructed by your physician, physical therapist or athletic trainer, add ____________________ at the end of the towel. Repeat __________ times. Complete this exercise __________ times per day. STRENGTH - Ankle Inversion  Secure one end of a rubber exercise band/tubing to a fixed object (table, pole). Loop the other end around your foot just before your toes.  Place your fists between your knees. This will focus your strengthening at your ankle.  Slowly, pull your big toe up and in, making sure the band/tubing is positioned to resist the entire motion.  Hold this position for __________ seconds.  Have your muscles resist the band/tubing as it slowly pulls your foot back to the starting position. Repeat __________ times. Complete this exercises __________ times per day.    This information is not intended to replace advice given to you by your health care provider. Make sure you  discuss any questions you have with your health care provider.   Document Released: 04/29/2005 Document Revised: 09/13/2014 Document Reviewed: 08/11/2008 Elsevier Interactive Patient Education [employer]

## 2016-01-25 NOTE — Progress Notes (Signed)
Pt is having pain in his left foot.

## 2016-01-26 LAB — HIV ANTIBODY (ROUTINE TESTING W REFLEX): HIV 1&2 Ab, 4th Generation: NONREACTIVE

## 2016-01-31 ENCOUNTER — Telehealth: Payer: Self-pay

## 2016-01-31 NOTE — Telephone Encounter (Signed)
Pt did not have VM set up to leave a message.

## 2016-02-07 ENCOUNTER — Telehealth: Payer: Self-pay

## 2016-02-07 NOTE — Telephone Encounter (Signed)
Pt was called and informed of results on 9/27. 

## 2016-03-04 ENCOUNTER — Encounter: Payer: Self-pay | Admitting: Family Medicine

## 2016-03-04 ENCOUNTER — Ambulatory Visit: Payer: Self-pay | Attending: Family Medicine | Admitting: Family Medicine

## 2016-03-04 VITALS — BP 126/87 | HR 76 | Temp 97.9°F | Ht 66.0 in | Wt 154.6 lb

## 2016-03-04 DIAGNOSIS — F17210 Nicotine dependence, cigarettes, uncomplicated: Secondary | ICD-10-CM | POA: Insufficient documentation

## 2016-03-04 DIAGNOSIS — M722 Plantar fascial fibromatosis: Secondary | ICD-10-CM | POA: Insufficient documentation

## 2016-03-04 LAB — POCT GLYCOSYLATED HEMOGLOBIN (HGB A1C): Hemoglobin A1C: 5.1

## 2016-03-04 NOTE — Assessment & Plan Note (Addendum)
Plantar fasciitis  Advised home care with NSAID, ice, cushioned insoles

## 2016-03-04 NOTE — Patient Instructions (Addendum)
Christopher Hicks was seen today for follow-up.  Diagnoses and all orders for this visit:  Plantar fasciitis, bilateral -     HgB A1c -     COMPLETE METABOLIC PANEL WITH GFR   Dr. Armen PickupFunches

## 2016-03-04 NOTE — Progress Notes (Signed)
LOGO@  Subjective:  Patient ID: Christopher Hicks, male    DOB: [birth year]  Age: 25 y.o. MRN: [id number]  CC: Follow-up (foot pain)   HPI Kal L Grob presents for   1. Plantar fasciitis: b/l foot pain for past 10 months. Icing rarely. No taking naproxen. Has not purchased insole for shoes. 10/10  pain first thing in AM when he stands. He also has pain during cold and rainy weather. Also has pain at the end of a long work day. No swelling.   2. Smoking: has smoked off and on since age 87. Have quit in the past. No cough, CP or shortness of breath. Plan to quit for next New Year/birthday.   No past medical history on file.  Past Surgical History:  Procedure Laterality Date  . APPENDECTOMY  05/2005  . arm surgery Right 05/2004   distal forearm fracture     No family history on file.  Social History  Substance Use Topics  . Smoking status: Current Every Day Smoker    Types: Cigarettes  . Smokeless tobacco: Never Used  . Alcohol use Yes    ROS Review of Systems  Constitutional: Negative for chills, fatigue, fever and unexpected weight change.  Eyes: Negative for visual disturbance.  Respiratory: Negative for cough and shortness of breath.   Cardiovascular: Negative for chest pain, palpitations and leg swelling.  Gastrointestinal: Negative for abdominal pain, blood in stool, constipation, diarrhea, nausea and vomiting.  Endocrine: Negative for polydipsia, polyphagia and polyuria.  Musculoskeletal: Negative for arthralgias, back pain, gait problem, myalgias and neck pain.  Skin: Negative for rash.  Allergic/Immunologic: Negative for immunocompromised state.  Hematological: Negative for adenopathy. Does not bruise/bleed easily.  Psychiatric/Behavioral: Negative for dysphoric mood, sleep disturbance and suicidal ideas. The patient is not nervous/anxious.     Objective:   Today's Vitals: BP 126/87 (BP Location: Right Arm, Patient Position: Sitting, Cuff Size: Small)   Pulse  76   Temp 97.9 F (36.6 C) (Oral)   Ht 5\' 6"  (1.676 m)   Wt 154 lb 9.6 oz (70.1 kg)   SpO2 99%   BMI 24.95 kg/m  BP Readings from Last 3 Encounters:  03/04/16 126/87  01/25/16 (!) 131/92  01/06/16 120/85    Physical Exam  Constitutional: He appears well-developed and well-nourished. No distress.  HENT:  Head: Normocephalic and atraumatic.  Neck: Normal range of motion. Neck supple.  Cardiovascular: Normal rate, regular rhythm, normal heart sounds and intact distal pulses.   Pulmonary/Chest: Effort normal and breath sounds normal.  Musculoskeletal: He exhibits no edema or tenderness.       Right ankle: Normal.       Left ankle: Normal.       Right foot: Normal.       Left foot: Normal.  Flat feet with b/l pes planus   Neurological: He is alert.  Skin: Skin is warm and dry. No rash noted. No erythema.  Psychiatric: He has a normal mood and affect.   Lab Results  Component Value Date   HGBA1C 5.1 03/04/2016    Problem List Items Addressed This Visit      Unprioritized   Plantar fasciitis, bilateral - Primary (Chronic)   Relevant Orders   HgB A1c (Completed)   COMPLETE METABOLIC PANEL WITH GFR    Other Visit Diagnoses   None.     Assessment & Plan:   Problem List Items Addressed This Visit      Unprioritized   Plantar fasciitis, bilateral -  Primary (Chronic)   Relevant Orders   HgB A1c   COMPLETE METABOLIC PANEL WITH GFR    Other Visit Diagnoses   None.     Outpatient Encounter Prescriptions as of 03/04/2016  Medication Sig  . acetaminophen (TYLENOL) 500 MG tablet Take 1,500 mg by mouth every 6 (six) hours as needed. For headache  . naproxen (NAPROSYN) 500 MG tablet Take 1 tablet (500 mg total) by mouth 2 (two) times daily with a meal.  . ranitidine (ZANTAC) 150 MG tablet Take 150 mg by mouth daily.   No facility-administered encounter medications on file as of 03/04/2016.     Follow-up: Return in about 2 months (around 05/04/2016) for plantar  fascitis .    Dessa PhiJosalyn Lashannon Bresnan MD

## 2016-03-05 LAB — COMPLETE METABOLIC PANEL WITH GFR
ALT: 10 U/L (ref 9–46)
AST: 19 U/L (ref 10–40)
Albumin: 4.6 g/dL (ref 3.6–5.1)
Alkaline Phosphatase: 78 U/L (ref 40–115)
BUN: 16 mg/dL (ref 7–25)
CALCIUM: 9.9 mg/dL (ref 8.6–10.3)
CHLORIDE: 103 mmol/L (ref 98–110)
CO2: 27 mmol/L (ref 20–31)
CREATININE: 1.44 mg/dL — AB (ref 0.60–1.35)
GFR, EST AFRICAN AMERICAN: 77 mL/min (ref >=60)
GFR, Est Non African American: 67 mL/min (ref >=60)
Glucose, Bld: 73 mg/dL (ref 65–99)
POTASSIUM: 4.2 mmol/L (ref 3.5–5.3)
Sodium: 138 mmol/L (ref 135–146)
Total Bilirubin: 1.6 mg/dL — ABNORMAL HIGH (ref 0.2–1.2)
Total Protein: 7 g/dL (ref 6.1–8.1)

## 2016-03-06 ENCOUNTER — Telehealth: Payer: Self-pay

## 2016-03-06 ENCOUNTER — Other Ambulatory Visit: Payer: Self-pay

## 2016-03-06 DIAGNOSIS — M722 Plantar fascial fibromatosis: Secondary | ICD-10-CM

## 2016-03-06 MED ORDER — NAPROXEN 500 MG PO TABS: 500.0000 mg | ORAL_TABLET | Freq: Two times a day (BID) | ORAL | 0 refills | Status: DC

## 2016-03-06 MED FILL — NAPROXEN 500 MG TABLET: 500 | 30 days supply | Qty: 30 | Fill #0

## 2016-03-06 NOTE — Telephone Encounter (Signed)
Pt was called and informed of lab results on 10/25. 

## 2016-09-25 ENCOUNTER — Encounter: Payer: Self-pay | Admitting: Family Medicine

## 2016-10-22 ENCOUNTER — Ambulatory Visit: Payer: Self-pay | Attending: Family Medicine | Admitting: Family Medicine

## 2016-10-22 ENCOUNTER — Encounter: Payer: Self-pay | Admitting: Family Medicine

## 2016-10-22 VITALS — BP 126/76 | HR 67 | Temp 97.9°F | Wt 150.6 lb

## 2016-10-22 DIAGNOSIS — R531 Weakness: Secondary | ICD-10-CM | POA: Insufficient documentation

## 2016-10-22 DIAGNOSIS — M65849 Other synovitis and tenosynovitis, unspecified hand: Secondary | ICD-10-CM | POA: Insufficient documentation

## 2016-10-22 DIAGNOSIS — F17210 Nicotine dependence, cigarettes, uncomplicated: Secondary | ICD-10-CM | POA: Insufficient documentation

## 2016-10-22 DIAGNOSIS — M778 Other enthesopathies, not elsewhere classified: Secondary | ICD-10-CM | POA: Insufficient documentation

## 2016-10-22 DIAGNOSIS — J301 Allergic rhinitis due to pollen: Secondary | ICD-10-CM

## 2016-10-22 DIAGNOSIS — M25532 Pain in left wrist: Secondary | ICD-10-CM | POA: Insufficient documentation

## 2016-10-22 DIAGNOSIS — J302 Other seasonal allergic rhinitis: Secondary | ICD-10-CM | POA: Insufficient documentation

## 2016-10-22 DIAGNOSIS — K921 Melena: Secondary | ICD-10-CM

## 2016-10-22 LAB — HEMOCCULT GUIAC POC 1CARD (OFFICE): FECAL OCCULT BLD: NEGATIVE

## 2016-10-22 MED ORDER — NAPROXEN 500 MG PO TABS: 500.0000 mg | ORAL_TABLET | Freq: Two times a day (BID) | ORAL | 0 refills | Status: DC

## 2016-10-22 MED ORDER — CETIRIZINE HCL 10 MG PO TABS: 10.0000 mg | ORAL_TABLET | Freq: Every day | ORAL | 11 refills | Status: DC

## 2016-10-22 MED FILL — ?CETIRIZINE HCL 10 MG TABLE: 10 | 30 days supply | Qty: 30 | Fill #0

## 2016-10-22 MED FILL — NAPROXEN 500 MG TABLET: 500 | 30 days supply | Qty: 30 | Fill #0

## 2016-10-22 NOTE — Assessment & Plan Note (Signed)
Exam and history consistent with tendinitis of flexor tendons with possible Berline Lopese Quervains  Plan: Ice Rest Naproxen Bracing

## 2016-10-22 NOTE — Progress Notes (Signed)
Subjective:  Patient ID: Christopher Hicks, male    DOB: [birth year]  Age: 26 y.o. MRN: [id number]  CC: Wrist Pain   HPI Christopher Hicks presents for   1. Blood in stool: painless bright red blood on tissue started on 10/04/16. He stopped eating spicy and fatty food. Symptoms stopped after one week.  He had similar symptoms about 2 years ago. He denies excessive NSAID.    2. Left wrist pain: he is left handed. Symptoms on and off for past 1-2 months. No previous injury. Pain around the entire wrist. Sometimes pain radiates to L thumb. Some weakness in his hand. No numbness in wrist. Pain is achy. Pain is exacerbated by lifting with L hand and flexion/extension of his L wrist.   3. Seasonal allergies: he is taking cetirizine 10 mg daily which helps.   Social History  Substance Use Topics  . Smoking status: Current Every Day Smoker    Types: Cigarettes  . Smokeless tobacco: Never Used  . Alcohol use Yes    Outpatient Medications Prior to Visit  Medication Sig Dispense Refill  . acetaminophen (TYLENOL) 500 MG tablet Take 1,500 mg by mouth every 6 (six) hours as needed. For headache    . naproxen (NAPROSYN) 500 MG tablet Take 1 tablet (500 mg total) by mouth 2 (two) times daily with a meal. (Patient not taking: Reported on 10/22/2016) 30 tablet 0  . ranitidine (ZANTAC) 150 MG tablet Take 150 mg by mouth daily.     No facility-administered medications prior to visit.     ROS Review of Systems  Constitutional: Negative for chills, fatigue, fever and unexpected weight change.  Eyes: Negative for visual disturbance.  Respiratory: Negative for cough and shortness of breath.   Cardiovascular: Negative for chest pain, palpitations and leg swelling.  Gastrointestinal: Negative for abdominal pain, blood in stool, constipation, diarrhea, nausea and vomiting.  Endocrine: Negative for polydipsia, polyphagia and polyuria.  Musculoskeletal: Positive for arthralgias. Negative for back pain, gait  problem, myalgias and neck pain.  Skin: Negative for rash.  Allergic/Immunologic: Negative for immunocompromised state.  Hematological: Negative for adenopathy. Does not bruise/bleed easily.  Psychiatric/Behavioral: Negative for dysphoric mood, sleep disturbance and suicidal ideas. The patient is not nervous/anxious.     Objective:  BP 126/76   Pulse 67   Temp 97.9 F (36.6 C) (Oral)   Wt 150 lb 9.6 oz (68.3 kg)   SpO2 98%   BMI 24.31 kg/m   BP/Weight 10/22/2016 03/04/2016 01/25/2016  Systolic BP 126 126 131  Diastolic BP 76 87 92  Wt. (Lbs) 150.6 154.6 151.8  BMI 24.31 24.95 24.5      Physical Exam  Constitutional: He appears well-developed and well-nourished. No distress.  HENT:  Head: Normocephalic and atraumatic.  Neck: Normal range of motion. Neck supple.  Cardiovascular: Normal rate, regular rhythm, normal heart sounds and intact distal pulses.   Pulmonary/Chest: Effort normal and breath sounds normal.  Genitourinary: Rectum normal. Rectal exam shows guaiac negative stool.  Musculoskeletal: He exhibits no edema.       Left wrist: He exhibits tenderness (dorsal wrist and thumb. worse with flexion, also ). He exhibits normal range of motion, no bony tenderness, no swelling, no effusion, no crepitus, no deformity and no laceration.  Neurological: He is alert.  Skin: Skin is warm and dry. No rash noted. No erythema.  Psychiatric: He has a normal mood and affect.     Assessment & Plan:  Christopher Hicks was seen today for  wrist pain.  Diagnoses and all orders for this visit:  Seasonal allergic rhinitis due to pollen -     cetirizine (ZYRTEC) 10 MG tablet; Take 1 tablet (10 mg total) by mouth daily.  Blood in stool -     Hemoccult - 1 Card (office)  Left wrist tendinitis -     naproxen (NAPROSYN) 500 MG tablet; Take 1 tablet (500 mg total) by mouth 2 (two) times daily with a meal.   There are no diagnoses linked to this encounter.  No orders of the defined types were  placed in this encounter.   Follow-up: Return if symptoms worsen or fail to improve.   Dessa PhiJosalyn Malicia Blasdel MD

## 2016-10-22 NOTE — Assessment & Plan Note (Signed)
Report of transient blood in stool Normal exam Hemodynamically stable  Plan: Reassurance

## 2016-10-22 NOTE — Patient Instructions (Addendum)
Christopher Hicks was seen today for wrist pain.  Diagnoses and all orders for this visit:  Seasonal allergic rhinitis due to pollen -     cetirizine (ZYRTEC) 10 MG tablet; Take 1 tablet (10 mg total) by mouth daily.  Blood in stool -     Hemoccult - 1 Card (office)  Left wrist tendinitis -     naproxen (NAPROSYN) 500 MG tablet; Take 1 tablet (500 mg total) by mouth 2 (two) times daily with a meal.  stool is negative for blood today  Rest your Left wrist Take naproxen with food to treat inflammation Ice for 20 minute twice daily Wearing the brace also helps especially after a particularly hard work day.  F/u in 3 months, sooner if needed   Dr. Armen PickupFunches   Flexor Carpi Ulnaris and Flexor Carpi Radialis Tendinitis What are the causes? These conditions may be caused by:  Repetitive motions or overuse (common).  Wear and tear. (common).  An injury.  Excessive exercise or strain.  Certain antibiotic medicines.  In some cases, the cause may not be known. What increases the risk? These conditions are more likely to develop in:  People who play sports that involve constantly flexing or stretching the wrist and forearm, such as volleyball and water polo.  Older adults.  People with have a job that involves flexing the wrist over and over, such as people who work as Journalist, newspapertypists, Tax inspectorbutchers, Facilities managerand cashiers.  People with certain health conditions, such as: ? Rheumatoid arthritis. ? Gout. ? Diabetes.  What are the signs or symptoms? Symptoms of these conditions may develop gradually. Symptoms include:  Pain or tenderness in the wrist.  Pain when flexing or stretching the wrist.  Pain when gripping or lifting with the palm of the hand.  Swelling.  How is this diagnosed? This condition may be diagnosed based on:  Your symptoms.  Your medical history.  A physical exam.  During the physical exam, you may be asked to move your hand, wrist, and arm in certain ways. In order to rule  out another condition, your health care provider may order one or more of the following tests:  MRI to get detailed images of the body's soft tissues and detect tendon tears and inflammation.  Ultrasound to detect soft-tissue injuries, such as tears and inflammation of the ligaments or tendons.  How is this treated? Treatment for this condition may include:  Rest. You should limit activities that cause your symptoms to get worse or flare up.  Heat and ice treatment. Both heat and cold can help to ease pain and may be applied to the wrist or forearm as needed to reduce pain and inflammation.  Splint. You may need to wear a splint to keep your wrist and forearm from moving (keep them immobilized) until your symptoms improve.  Medicine. Your health care provider may prescribe steroids or other anti-inflammatory medicines, like ibuprofen, to temporarily ease your pain and other symptoms.  Physical therapy. Your health care provider may ask you to do exercises to maintain mobility and range of motion in your wrist.  Follow these instructions at home: If you have a splint:  Wear it as told by your health care provider. Remove it only as told by your health care provider.  Loosen the splint if your fingers tingle, become numb, or turn cold and blue.  Do not let your splint get wet if it is not waterproof.  Keep the splint clean. Managing pain, stiffness, and swelling  If  directed, apply ice to the injured area. ? Put ice in a plastic bag. ? Place a damp towel between your skin and the bag. ? Leave the ice on for 20 minutes, 2-3 times a day.  Move your fingers often to avoid stiffness and to lessen swelling.  Raise (elevate) the injured area above the level of your heart while you are sitting or lying down. Activity  Return to your normal activities as told by your health care provider. Ask your health care provider what activities are safe for you.  Do exercises as told by your  health care provider. General instructions  Do not use any tobacco products, including cigarettes, chewing tobacco, or e-cigarettes. Tobacco can delay healing. If you need help quitting, ask your health care provider.  Take over-the-counter and prescription medicines only as told by your health care provider.  Keep all follow-up visits as told by your health care provider. This is important. How is this prevented?  Warm up and stretch before being active.  Cool down and stretch after being active.  Give your body time to rest between periods of activity.  Make sure to use equipment that fits you.  Be safe and responsible while being active to avoid falls.  Do at least 150 minutes of moderate-intensity exercise each week, such as brisk walking or water aerobics.  Maintain physical fitness, including: ? Strength. ? Flexibility. ? Cardiovascular fitness. ? Endurance. Contact a health care provider if:  Your pain does not improve.  Your pain gets worse. Get help right away if:  Your pain is severe.  You cannot move your wrist. This information is not intended to replace advice given to you by your health care provider. Make sure you discuss any questions you have with your health care provider. Document Released: 04/29/2005 Document Revised: 01/02/2016 Document Reviewed: 01/06/2015 Elsevier Interactive Patient Education  2017 [employer].

## 2016-10-22 NOTE — Assessment & Plan Note (Signed)
Refilled zyrtec

## 2016-12-16 MED FILL — ?CETIRIZINE HCL 10 MG TABLE: 10 | 30 days supply | Qty: 30 | Fill #1

## 2017-01-24 ENCOUNTER — Ambulatory Visit: Payer: Self-pay | Admitting: Internal Medicine

## 2017-03-03 ENCOUNTER — Ambulatory Visit: Payer: Self-pay | Admitting: Internal Medicine

## 2017-08-12 ENCOUNTER — Encounter: Payer: Self-pay | Admitting: Nurse Practitioner

## 2017-08-12 ENCOUNTER — Ambulatory Visit: Payer: Self-pay | Attending: Nurse Practitioner | Admitting: Nurse Practitioner

## 2017-08-12 VITALS — BP 134/88 | HR 86 | Temp 98.3°F | Ht 66.0 in | Wt 169.8 lb

## 2017-08-12 DIAGNOSIS — Z0000 Encounter for general adult medical examination without abnormal findings: Secondary | ICD-10-CM

## 2017-08-12 DIAGNOSIS — Z09 Encounter for follow-up examination after completed treatment for conditions other than malignant neoplasm: Secondary | ICD-10-CM | POA: Insufficient documentation

## 2017-08-12 DIAGNOSIS — Z79899 Other long term (current) drug therapy: Secondary | ICD-10-CM | POA: Insufficient documentation

## 2017-08-12 DIAGNOSIS — Z9889 Other specified postprocedural states: Secondary | ICD-10-CM | POA: Insufficient documentation

## 2017-08-12 DIAGNOSIS — Z833 Family history of diabetes mellitus: Secondary | ICD-10-CM | POA: Insufficient documentation

## 2017-08-12 DIAGNOSIS — Z791 Long term (current) use of non-steroidal anti-inflammatories (NSAID): Secondary | ICD-10-CM | POA: Insufficient documentation

## 2017-08-12 DIAGNOSIS — Z9229 Personal history of other drug therapy: Secondary | ICD-10-CM

## 2017-08-12 DIAGNOSIS — Z8781 Personal history of (healed) traumatic fracture: Secondary | ICD-10-CM | POA: Insufficient documentation

## 2017-08-12 DIAGNOSIS — Z8249 Family history of ischemic heart disease and other diseases of the circulatory system: Secondary | ICD-10-CM | POA: Insufficient documentation

## 2017-08-12 NOTE — Patient Instructions (Signed)

## 2017-08-12 NOTE — Progress Notes (Signed)
Assessment & Plan:  Christopher Hicks was seen today for establish care.  Diagnoses and all orders for this visit:  Routine health maintenance Apply for financial assistance and once schedule appointment for fasting labs and physical.   Patient has been counseled on age-appropriate routine health concerns for screening and prevention. These are reviewed and up-to-date. Referrals have been placed accordingly. Immunizations are up-to-date or declined.    Subjective:   Chief Complaint  Patient presents with  . Establish Care    Patient is here to establish care for primary care.    HPI Christopher Hicks 27 y.o. male presents to office today to establish care. He has not significant past medical history and states he came here today to have a physical and to obtain records of his last tetanus as he was informed that a tetanus vaccination was required with the impending birth of his son in a few weeks. I did instruct him that his tetanus was administered in 2017 and he is not due for another vaccine for the next 10 years. He will need to speak with the financial counselor for financial assistance. He will schedule his physical in a few weeks.   Review of Systems  Constitutional: Negative for fever, malaise/fatigue and weight loss.  Respiratory: Negative.  Negative for cough and shortness of breath.   Cardiovascular: Negative.  Negative for chest pain, palpitations and leg swelling.  Gastrointestinal: Positive for heartburn. Negative for vomiting.  Neurological: Negative.  Negative for sensory change, focal weakness, seizures and headaches.  Endo/Heme/Allergies: Positive for environmental allergies.  Psychiatric/Behavioral: Negative.  Negative for suicidal ideas.    History reviewed. No pertinent past medical history.  Past Surgical History:  Procedure Laterality Date  . APPENDECTOMY  05/2005  . arm surgery Right 05/2004   distal forearm fracture     Family History  Problem Relation Age of  Onset  . Diabetes Other   . Hypertension Maternal Grandmother     Social History Reviewed with no changes to be made today.   Outpatient Medications Prior to Visit  Medication Sig Dispense Refill  . cetirizine (ZYRTEC) 10 MG tablet Take 1 tablet (10 mg total) by mouth daily. 30 tablet 11  . acetaminophen (TYLENOL) 500 MG tablet Take 1,500 mg by mouth every 6 (six) hours as needed. For headache    . naproxen (NAPROSYN) 500 MG tablet Take 1 tablet (500 mg total) by mouth 2 (two) times daily with a meal. (Patient not taking: Reported on 08/12/2017) 30 tablet 0  . ranitidine (ZANTAC) 150 MG tablet Take 150 mg by mouth daily.     No facility-administered medications prior to visit.     No Known Allergies     Objective:    BP 134/88 (BP Location: Left Arm, Patient Position: Sitting, Cuff Size: Normal)   Pulse 86   Temp 98.3 F (36.8 C) (Oral)   Ht 5\' 6"  (1.676 m)   Wt 169 lb 12.8 oz (77 kg)   SpO2 96%   BMI 27.41 kg/m  Wt Readings from Last 3 Encounters:  08/12/17 169 lb 12.8 oz (77 kg)  10/22/16 150 lb 9.6 oz (68.3 kg)  03/04/16 154 lb 9.6 oz (70.1 kg)    Physical Exam  Constitutional: He is oriented to person, place, and time. He appears well-developed and well-nourished. He is cooperative.  HENT:  Head: Normocephalic and atraumatic.  Eyes: EOM are normal.  Neck: Normal range of motion.  Cardiovascular: Normal rate, regular rhythm and normal heart sounds.  Exam reveals no gallop and no friction rub.  No murmur heard. Pulmonary/Chest: Effort normal and breath sounds normal. No tachypnea. No respiratory distress. He has no decreased breath sounds. He has no wheezes. He has no rhonchi. He has no rales. He exhibits no tenderness.  Abdominal: Bowel sounds are normal.  Musculoskeletal: Normal range of motion.  Neurological: He is alert and oriented to person, place, and time. Coordination normal.  Skin: Skin is warm and dry.  Psychiatric: He has a normal mood and affect. His  behavior is normal. Judgment and thought content normal.  Nursing note and vitals reviewed.      Patient has been counseled extensively about nutrition and exercise as well as the importance of adherence with medications and regular follow-up. The patient was given clear instructions to go to ER or return to medical center if symptoms don't improve, worsen or new problems develop. The patient verbalized understanding.   Follow-up: No follow-ups on file.   Claiborne RiggZelda W Fleming, FNP-BC [facility] [place], [place] [phone]   08/12/2017, 8:42 PM

## 2017-11-28 ENCOUNTER — Encounter: Payer: Self-pay | Admitting: Nurse Practitioner

## 2017-11-28 ENCOUNTER — Telehealth: Payer: Self-pay

## 2017-11-28 NOTE — Telephone Encounter (Signed)
Error

## 2018-01-28 ENCOUNTER — Encounter: Payer: Self-pay | Admitting: Family Medicine

## 2018-01-28 ENCOUNTER — Ambulatory Visit: Payer: Self-pay | Attending: Family Medicine | Admitting: Family Medicine

## 2018-01-28 VITALS — BP 123/80 | HR 59 | Temp 97.6°F | Ht 66.0 in | Wt 164.2 lb

## 2018-01-28 DIAGNOSIS — S6391XA Sprain of unspecified part of right wrist and hand, initial encounter: Secondary | ICD-10-CM

## 2018-01-28 DIAGNOSIS — S6392XA Sprain of unspecified part of left wrist and hand, initial encounter: Secondary | ICD-10-CM | POA: Insufficient documentation

## 2018-01-28 DIAGNOSIS — M79642 Pain in left hand: Secondary | ICD-10-CM | POA: Insufficient documentation

## 2018-01-28 DIAGNOSIS — X500XXA Overexertion from strenuous movement or load, initial encounter: Secondary | ICD-10-CM | POA: Insufficient documentation

## 2018-01-28 NOTE — Patient Instructions (Signed)
Intermetacarpal Sprain An intermetacarpal sprain happens when tissues between bones in the hand (metacarpals) become overstretched or torn(ruptured). This usually happens because of an injury to the hand. Intermetacarpal sprains range from mild to severe. They can take up to 2-12 weeks to heal, with proper treatment. What are the causes? This injury is caused by excess pressure or strain (stress) that is applied to the intermetacarpal ligaments. This often happens because of a hard, direct hit or injury (trauma) to the hand. What increases the risk? The following factors may make you more likely to develop this injury:  A previous hand injury.  Doing repetitive motions with your hands, such as movements in sports or heavy labor.  Having poor strength and flexibility in your hands.  What are the signs or symptoms? Symptoms of this injury may include:  A feeling of popping or tearing inside the hand.  Pain and inflammation, especially in the knuckles.  Bruising.  Limited range of motion of the hand.  How is this diagnosed? This injury is diagnosed based on a physical exam and your medical history. You may have X-rays to check for breaks (fractures) in your bones. Your sprain may be rated in degrees, based on how severe it is. The ratings include:  First-degree. A ligamentis stretched but it still has its normal shape.  Second-degree. A ligament is partially ruptured, and you may have some difficulty moving your hand normally.  Third-degree. A ligament is completely ruptured, and you may not be able to move the affected hand.  How is this treated? This injury is treated by resting, icing, raising (elevating), and applying pressure (compression) to the injured area. Depending on the severity of your sprain, treatment may also include:  Medicines that help to relieve pain.  Keeping your hand in a fixed position (immobilization) for a period of time. This may be done using a  bandage (dressing), a cast, or a splint.  Exercises to strengthen and stretch your hand. You may be referred to a physical therapist.  Surgery. This is rare.  Follow these instructions at home: If you have a cast:  Do not stick anything inside the cast to scratch your skin. Doing that increases your risk of infection.  Check the skin around the cast every day. Report any concerns to your health care provider. You may put lotion on dry skin around the edges of the cast. Do not apply lotion to the skin underneath the cast.  Do not let your cast get wet if it is not waterproof.  Keep the cast clean. If you have a splint:  Wear the splint as told by your health care provider. Remove it only as told by your health care provider.  Loosen the splint if your fingers tingle, become numb, or turn cold and blue.  Do not let your splint get wet if it is not waterproof.  Keep the splint clean. Bathing  If you have a cast, splint, or dressing, do not take baths, swim, or use a hot tub until your health care provider approves. Ask your health care provider if you can take showers. You may only be allowed to take sponge baths for bathing.  If you have a cast or splint that is not waterproof, cover it with a watertight plastic bag when you take a bath or a shower. Managing pain, stiffness, and swelling  If directed, apply ice to the injured area: ? Put ice in a plastic bag. ? Place a towel between your skin   and the bag. ? Leave the ice on for 20 minutes, 2-3 times per day.  Move your fingers often to avoid stiffness and to lessen swelling.  Elevate your hand above the level of your heart while you are sitting or lying down.  Wear a compression wrap only as told by your health care provider. Driving  Do not drive or operate heavy machinery while taking prescription pain medicine.  Ask your health care provider when it is safe to drive if you have a cast or splint on a hand that you use  for driving. Activity  Return to your normal activities as told by your health care provider. Ask your health care provider what activities are safe for you.  Avoid activities that cause pain or make your condition worse.  Do exercises as told by your health care provider or physical therapist. General instructions  Take over-the-counter and prescription medicines only as told by your health care provider.  If you have a cast or a splint, do not put pressure on any part of the cast or splint until it is fully hardened. This may take several hours.  Do not wear rings on the fingers of your injured hand.  Keep all follow-up visits as told by your health care provider. This is important. Contact a health care provider if:  You have symptoms that do not get better after 2 weeks of treatment.  You have more redness, swelling, or pain in your injured area.  You have a fever.  Your cast or splint gets damaged. Get help right away if:  You have severe pain.  You develop numbness in your hand or fingers.  You cannot move your hand or fingers.  Your hand or fingers feel unusually cold.  Your hand or fingers turn blue.  Your fingernails turn a dark color, such as blue or gray. This information is not intended to replace advice given to you by your health care provider. Make sure you discuss any questions you have with your health care provider. Document Released: 04/29/2005 Document Revised: 10/05/2015 Document Reviewed: 10/17/2014 Elsevier Interactive Patient Education  2018 [employer] Inc[employer]  

## 2018-01-28 NOTE — Progress Notes (Signed)
Subjective:  Patient ID: Christopher Hicks, male    DOB: 12/12/1990  Age: 27 y.o. MRN: [id number]  CC: Hand Pain   HPI Christopher Hicks is a 27 year old left-handed male patient of Bertram DenverZelda Fleming who presents today [facility] for left hand pain thought to be secondary to a sprain.  This occurred about 2 months ago while lifting a heavy box at work as he works in the delivery section of [employer] and had noticed pain on the lateral aspect of the dorsum of his right hand. He had been released to return to work with light duty however he states that his place of work there is no light duty. Today he presents wanting to return without restrictions and states he is asymptomatic and has full range of motion of his hands and is able to make a fist. He has no other acute concerns.  History reviewed. No pertinent past medical history.  Past Surgical History:  Procedure Laterality Date  . APPENDECTOMY  05/2005  . arm surgery Right 05/2004   distal forearm fracture     No Known Allergies    Outpatient Medications Prior to Visit  Medication Sig Dispense Refill  . acetaminophen (TYLENOL) 500 MG tablet Take 1,500 mg by mouth every 6 (six) hours as needed. For headache    . cetirizine (ZYRTEC) 10 MG tablet Take 1 tablet (10 mg total) by mouth daily. (Patient not taking: Reported on 01/28/2018) 30 tablet 11  . ranitidine (ZANTAC) 150 MG tablet Take 150 mg by mouth daily.     No facility-administered medications prior to visit.     ROS Review of Systems  Constitutional: Negative for activity change and appetite change.  HENT: Negative for sinus pressure and sore throat.   Eyes: Negative for visual disturbance.  Respiratory: Negative for cough, chest tightness and shortness of breath.   Cardiovascular: Negative for chest pain and leg swelling.  Gastrointestinal: Negative for abdominal distention, abdominal pain, constipation and diarrhea.  Endocrine:  Negative.   Genitourinary: Negative for dysuria.  Musculoskeletal: Negative for joint swelling and myalgias.  Skin: Negative for rash.  Allergic/Immunologic: Negative.   Neurological: Negative for weakness, light-headedness and numbness.  Psychiatric/Behavioral: Negative for dysphoric mood and suicidal ideas.    Objective:  BP 123/80   Pulse (!) 59   Temp 97.6 F (36.4 C) (Oral)   Ht 5\' 6"  (1.676 m)   Wt 164 lb 3.2 oz (74.5 kg)   SpO2 99%   BMI 26.50 kg/m   BP/Weight 01/28/2018 08/12/2017 10/22/2016  Systolic BP 123 134 126  Diastolic BP 80 88 76  Wt. (Lbs) 164.2 169.8 150.6  BMI 26.5 27.41 24.31      Physical Exam  Constitutional: He is oriented to person, place, and time. He appears well-developed and well-nourished.  Cardiovascular: Normal rate, normal heart sounds and intact distal pulses.  No murmur heard. Pulmonary/Chest: Effort normal and breath sounds normal. He has no wheezes. He has no rales. He exhibits no tenderness.  Abdominal: Soft. Bowel sounds are normal. He exhibits no distension and no mass. There is no tenderness.  Musculoskeletal: Normal range of motion.  Neurological: He is alert and oriented to person, place, and time.     Assessment & Plan:   1. Sprain of right hand, initial encounter Resolved Lifting precautions Provided note to return to work with no restrictions.   No orders of the defined types were placed in this encounter.  Follow-up: Return in about 3 months (around 04/29/2018) for follow up of chronic medical conditions with PCP.   Hoy Register MD

## 2018-01-28 NOTE — Progress Notes (Signed)
Patient needs note to return back to work for light duty due to hand pain.

## 2018-02-17 ENCOUNTER — Ambulatory Visit: Payer: Self-pay | Admitting: Nurse Practitioner

## 2018-03-02 ENCOUNTER — Other Ambulatory Visit: Payer: Self-pay

## 2018-03-02 ENCOUNTER — Encounter (HOSPITAL_COMMUNITY): Payer: Self-pay

## 2018-03-02 ENCOUNTER — Ambulatory Visit (HOSPITAL_COMMUNITY)
Admission: EM | Admit: 2018-03-02 | Discharge: 2018-03-02 | Disposition: A | Discharge status: Home / Self Care | Payer: Managed Care, Other (non HMO) | Attending: Family Medicine | Admitting: Family Medicine

## 2018-03-02 DIAGNOSIS — M25531 Pain in right wrist: Secondary | ICD-10-CM | POA: Diagnosis not present

## 2018-03-02 DIAGNOSIS — M25532 Pain in left wrist: Secondary | ICD-10-CM

## 2018-03-02 MED ORDER — MELOXICAM 7.5 MG PO TABS: 7.5000 mg | ORAL_TABLET | Freq: Every day | ORAL | 0 refills | Status: DC

## 2018-03-02 NOTE — ED Triage Notes (Signed)
Pt presents to [facility] for bilateral hand pain x1 month, pt denies any injuries. Pt states hands go numb from time to time and have a tingling numb pain.

## 2018-03-02 NOTE — ED Provider Notes (Signed)
[facility]    CSN: [id number] Arrival date & time: 03/02/18  1842     History   Chief Complaint Chief Complaint  Patient presents with  . Hand Pain    HPI Christopher Hicks is a 27 y.o. male.   27 year old male comes in for bilateral hand/wrist pain x 1 month. Denies injury/trauma. States symptoms first started on the right. He has intermittent numbness/tingling, worse on the right fourth finger. No obvious swelling. State pain is worse with movement. Work requires heavy lifting and repetitive motion. Has not tried anything for the symptoms.      History reviewed. No pertinent past medical history.  Patient Active Problem List   Diagnosis Date Noted  . Left wrist tendinitis 10/22/2016  . Blood in stool 10/22/2016  . Seasonal allergic rhinitis due to pollen 10/22/2016  . Plantar fasciitis, bilateral 01/25/2016  . Current smoker 01/25/2016    Past Surgical History:  Procedure Laterality Date  . APPENDECTOMY  05/2005  . arm surgery Right 05/2004   distal forearm fracture        Home Medications    Prior to Admission medications   Medication Sig Start Date End Date Taking? Authorizing Provider  cetirizine (ZYRTEC) 10 MG tablet Take 1 tablet (10 mg total) by mouth daily. 10/22/16  Yes Funches, Josalyn, MD  acetaminophen (TYLENOL) 500 MG tablet Take 1,500 mg by mouth every 6 (six) hours as needed. For headache    [provider]  meloxicam (MOBIC) 7.5 MG tablet Take 1 tablet (7.5 mg total) by mouth daily. 03/02/18   Cathie Hoops, Keeghan Bialy V, PA-C  ranitidine (ZANTAC) 150 MG tablet Take 150 mg by mouth daily.    [provider]    Family History Family History  Problem Relation Age of Onset  . Diabetes Other   . Hypertension Maternal Grandmother     Social History Social History   Tobacco Use  . Smoking status: Former Smoker    Types: Cigarettes  . Smokeless tobacco: Never Used  . Tobacco comment: Quit 9 month ago.   Substance Use Topics  .  Alcohol use: Yes  . Drug use: Yes    Types: Marijuana     Allergies   Patient has no known allergies.   Review of Systems Review of Systems  Reason unable to perform ROS: See HPI as above.     Physical Exam Triage Vital Signs ED Triage Vitals  Enc Vitals Group     BP 03/02/18 1904 121/61     Pulse Rate 03/02/18 1904 88     Resp 03/02/18 1904 17     Temp 03/02/18 1904 98.7 F (37.1 C)     Temp Source 03/02/18 1904 Oral     SpO2 03/02/18 1904 99 %     Weight --      Height --      Head Circumference --      Peak Flow --      Pain Score 03/02/18 1906 8     Pain Loc --      Pain Edu? --      Excl. in GC? --    No data found.  Updated Vital Signs BP 121/61 (BP Location: Left Arm)   Pulse 88   Temp 98.7 F (37.1 C) (Oral)   Resp 17   SpO2 99%   Physical Exam  Constitutional: He is oriented to person, place, and time. He appears well-developed and well-nourished. No distress.  HENT:  Head: Normocephalic and atraumatic.  Eyes: Pupils are equal, round, and reactive to light. Conjunctivae are normal.  Musculoskeletal:  Well healing scar to the dorsal right forearm from prior surgery. No swelling, erythema, increased warmth, contusion.  Diffuse tenderness to palpation of MCPs.  Full range of motion of wrist and fingers.  Decreased sensation on the right fourth finger.  Radial pulse 2+, cap refill less than 2 seconds.  Negative Tinel's, Phalen's, Finkelstein.  Neurological: He is alert and oriented to person, place, and time.  Skin: He is not diaphoretic.     UC Treatments / Results  Labs (all labs ordered are listed, but only abnormal results are displayed) Labs Reviewed - No data to display  EKG None  Radiology No results found.  Procedures Procedures (including critical care time)  Medications Ordered in UC Medications - No data to display  Initial Impression / Assessment and Plan / UC Course  I have reviewed the triage vital signs and the nursing  notes.  Pertinent labs & imaging results that were available during my care of the patient were reviewed by me and considered in my medical decision making (see chart for details).    Start mobic for pain in inflammation. Ice compress, elevation, wrist splint during activity. Return precautions given. Patient expresses understanding and agrees to plan.  Final Clinical Impressions(s) / UC Diagnoses   Final diagnoses:  Pain in both wrists    ED Prescriptions    Medication Sig Dispense Auth. Provider   meloxicam (MOBIC) 7.5 MG tablet Take 1 tablet (7.5 mg total) by mouth daily. 15 tablet Threasa Alpha, [place] 03/02/18 2002

## 2018-03-02 NOTE — Discharge Instructions (Signed)
Start Mobic. Do not take ibuprofen (motrin/advil)/ naproxen (aleve) while on mobic.  Ice compress, elevation, wrist brace during activity.  This may take a few weeks to completely resolve, but should be feeling better each week.  Follow-up with PCP for further evaluation if symptoms not improving.

## 2018-03-09 ENCOUNTER — Ambulatory Visit (HOSPITAL_COMMUNITY)
Admission: EM | Admit: 2018-03-09 | Discharge: 2018-03-09 | Disposition: A | Discharge status: Home / Self Care | Payer: Managed Care, Other (non HMO) | Attending: Family Medicine | Admitting: Family Medicine

## 2018-03-09 ENCOUNTER — Encounter (HOSPITAL_COMMUNITY): Payer: Self-pay | Admitting: Emergency Medicine

## 2018-03-09 DIAGNOSIS — M25532 Pain in left wrist: Secondary | ICD-10-CM | POA: Diagnosis not present

## 2018-03-09 DIAGNOSIS — M25531 Pain in right wrist: Secondary | ICD-10-CM | POA: Diagnosis not present

## 2018-03-09 DIAGNOSIS — Z0000 Encounter for general adult medical examination without abnormal findings: Secondary | ICD-10-CM

## 2018-03-09 DIAGNOSIS — Z0489 Encounter for examination and observation for other specified reasons: Secondary | ICD-10-CM | POA: Diagnosis not present

## 2018-03-09 NOTE — Discharge Instructions (Addendum)
We will give you a splint for the other wrist.  Wear those while at work and continue the meloxicam that was prescribed.  Follow up as needed for continued or worsening symptoms

## 2018-03-09 NOTE — ED Provider Notes (Signed)
[facility]    CSN: [id number] Arrival date & time: 03/09/18  1117     History   Chief Complaint Chief Complaint  Patient presents with  . Letter for School/Work    HPI Christopher Hicks is a 27 y.o. male.   Patient is a 27 year old male presents for a letter to return to work.  He was seen here on 03/02/2018.  The discomfort in his hands and wrist have since improved.  He has been using the wrist splint on the right wrist and taking the meloxicam for pain inflammation.  He is hoping to return to work tomorrow and is requesting a splint for the left wrist. He denies any fever, swelling. He denies any numbness, tingling, or radiation of pain.   ROS per HPI       History reviewed. No pertinent past medical history.  Patient Active Problem List   Diagnosis Date Noted  . Left wrist tendinitis 10/22/2016  . Blood in stool 10/22/2016  . Seasonal allergic rhinitis due to pollen 10/22/2016  . Plantar fasciitis, bilateral 01/25/2016  . Current smoker 01/25/2016    Past Surgical History:  Procedure Laterality Date  . APPENDECTOMY  05/2005  . arm surgery Right 05/2004   distal forearm fracture        Home Medications    Prior to Admission medications   Medication Sig Start Date End Date Taking? Authorizing Provider  acetaminophen (TYLENOL) 500 MG tablet Take 1,500 mg by mouth every 6 (six) hours as needed. For headache    [provider]  cetirizine (ZYRTEC) 10 MG tablet Take 1 tablet (10 mg total) by mouth daily. 10/22/16   Funches, Gerilyn Nestle, MD  meloxicam (MOBIC) 7.5 MG tablet Take 1 tablet (7.5 mg total) by mouth daily. 03/02/18   Cathie Hoops, Amy V, PA-C  ranitidine (ZANTAC) 150 MG tablet Take 150 mg by mouth daily.    [provider]    Family History Family History  Problem Relation Age of Onset  . Diabetes Other   . Hypertension Maternal Grandmother     Social History Social History   Tobacco Use  . Smoking status: Former Smoker   Types: Cigarettes  . Smokeless tobacco: Never Used  . Tobacco comment: Quit 9 month ago.   Substance Use Topics  . Alcohol use: Yes  . Drug use: Yes    Types: Marijuana     Allergies   Patient has no known allergies.   Review of Systems Review of Systems   Physical Exam Triage Vital Signs ED Triage Vitals [03/09/18 1217]  Enc Vitals Group     BP 129/84     Pulse Rate 89     Resp 18     Temp 97.8 F (36.6 C)     Temp Source Oral     SpO2 100 %     Weight      Height      Head Circumference      Peak Flow      Pain Score 0     Pain Loc      Pain Edu?      Excl. in GC?    No data found.  Updated Vital Signs BP 129/84 (BP Location: Right Arm)   Pulse 89   Temp 97.8 F (36.6 C) (Oral)   Resp 18   SpO2 100%   Visual Acuity Right Eye Distance:   Left Eye Distance:   Bilateral Distance:    Right Eye Near:  Left Eye Near:    Bilateral Near:     Physical Exam  Constitutional: He appears well-developed and well-nourished.  Very pleasant. Non toxic or ill appearing.   HENT:  Head: Normocephalic and atraumatic.  Eyes: Conjunctivae are normal.  Neck: Normal range of motion.  Pulmonary/Chest: Effort normal.  Musculoskeletal: Normal range of motion. He exhibits no edema, tenderness or deformity.  Good ROM of both hands and wrists. No swelling, deformities, erythema or bruising.   Neurological: He is alert.  Skin: Skin is warm.  Psychiatric: He has a normal mood and affect.  Nursing note and vitals reviewed.    UC Treatments / Results  Labs (all labs ordered are listed, but only abnormal results are displayed) Labs Reviewed - No data to display  EKG None  Radiology No results found.  Procedures Procedures (including critical care time)  Medications Ordered in UC Medications - No data to display  Initial Impression / Assessment and Plan / UC Course  I have reviewed the triage vital signs and the nursing notes.  Pertinent labs & imaging  results that were available during my care of the patient were reviewed by me and considered in my medical decision making (see chart for details).     Safe to return to work tomorrow. Splint given for the left wrist to use at work.  meloxicam as needed for pain and inflammation.  Follow up as needed for continued or worsening symptoms  Final Clinical Impressions(s) / UC Diagnoses   Final diagnoses:  Well adult health check     Discharge Instructions     We will give you a splint for the other wrist.  Wear those while at work and continue the meloxicam that was prescribed.  Follow up as needed for continued or worsening symptoms     ED Prescriptions    None     Controlled Substance Prescriptions [place] Controlled Substance Registry consulted? Not Applicable   Janace Aris, NP 03/09/18 1348

## 2018-03-09 NOTE — ED Triage Notes (Signed)
Pt requesting unrestricted work note

## 2018-03-18 ENCOUNTER — Encounter: Payer: Self-pay | Admitting: Nurse Practitioner

## 2018-03-18 ENCOUNTER — Ambulatory Visit: Payer: Managed Care, Other (non HMO) | Attending: Nurse Practitioner | Admitting: Nurse Practitioner

## 2018-03-18 VITALS — BP 119/75 | HR 87 | Temp 98.8°F | Ht 66.0 in | Wt 160.0 lb

## 2018-03-18 DIAGNOSIS — M25531 Pain in right wrist: Secondary | ICD-10-CM

## 2018-03-18 DIAGNOSIS — Z79899 Other long term (current) drug therapy: Secondary | ICD-10-CM | POA: Insufficient documentation

## 2018-03-18 DIAGNOSIS — Z0000 Encounter for general adult medical examination without abnormal findings: Secondary | ICD-10-CM | POA: Diagnosis not present

## 2018-03-18 DIAGNOSIS — Z791 Long term (current) use of non-steroidal anti-inflammatories (NSAID): Secondary | ICD-10-CM | POA: Insufficient documentation

## 2018-03-18 MED ORDER — TIZANIDINE HCL 4 MG PO CAPS: 4.0000 mg | ORAL_CAPSULE | Freq: Three times a day (TID) | ORAL | 0 refills | Status: AC | PRN

## 2018-03-18 MED FILL — tiZANidine HCL 4 MG TABS: 4 | 20 days supply | Qty: 60 | Fill #0

## 2018-03-18 NOTE — Progress Notes (Signed)
Assessment & Plan:  Christopher Hicks was seen today for annual exam.  Diagnoses and all orders for this visit:  Annual physical exam -     CBC -     CMP14+EGFR  Right wrist pain -     tiZANidine (ZANAFLEX) 4 MG capsule; Take 1 capsule (4 mg total) by mouth 3 (three) times daily as needed for muscle spasms. Wear wrist splint with activity.   Patient has been counseled on age-appropriate routine health concerns for screening and prevention. These are reviewed and up-to-date. Referrals have been placed accordingly. Immunizations are up-to-date or declined.    Subjective:   Chief Complaint  Patient presents with  . Annual Exam    Pt. is here for a physical    HPI Christopher Hicks 27 y.o. male presents to office today for annual physical exam.   Wrist Pain: Patient complaints of right wrist pain. This is evaluated as a personal injury. The pain is chronic and began several months ago. The pain is located primarily in the global area.  He describes the symptoms as aching, sharp and throbbing. Symptoms improve with rest, medication: muscle relaxant used and beneficial. The symptoms are worse with rotation and movement. The patient  does not have neck pain. The patient is active in none. Treatment to date has been muscle relaxant (his mother's), with significant relief. Has tried ibuprofen and aleve with little relief of pain.    Review of Systems  Constitutional: Negative for fever, malaise/fatigue and weight loss.  HENT: Negative.  Negative for nosebleeds.   Eyes: Negative.  Negative for blurred vision, double vision and photophobia.  Respiratory: Negative.  Negative for cough and shortness of breath.   Cardiovascular: Negative.  Negative for chest pain, palpitations and leg swelling.  Gastrointestinal: Negative.  Negative for heartburn, nausea and vomiting.  Genitourinary: Negative.   Musculoskeletal: Positive for joint pain. Negative for myalgias.  Skin: Negative.   Neurological:  Negative.  Negative for dizziness, focal weakness, seizures and headaches.  Endo/Heme/Allergies: Negative.   Psychiatric/Behavioral: Negative.  Negative for suicidal ideas.    History reviewed. No pertinent past medical history.  Past Surgical History:  Procedure Laterality Date  . APPENDECTOMY  05/2005  . arm surgery Right 05/2004   distal forearm fracture     Family History  Problem Relation Age of Onset  . Diabetes Other   . Hypertension Maternal Grandmother     Social History Reviewed with no changes to be made today.   Outpatient Medications Prior to Visit  Medication Sig Dispense Refill  . acetaminophen (TYLENOL) 500 MG tablet Take 1,500 mg by mouth every 6 (six) hours as needed. For headache    . cetirizine (ZYRTEC) 10 MG tablet Take 1 tablet (10 mg total) by mouth daily. (Patient not taking: Reported on 03/18/2018) 30 tablet 11  . meloxicam (MOBIC) 7.5 MG tablet Take 1 tablet (7.5 mg total) by mouth daily. (Patient not taking: Reported on 03/18/2018) 15 tablet 0  . ranitidine (ZANTAC) 150 MG tablet Take 150 mg by mouth daily.     No facility-administered medications prior to visit.     No Known Allergies     Objective:    BP 119/75 (BP Location: Left Arm, Patient Position: Sitting, Cuff Size: Normal)   Pulse 87   Temp 98.8 F (37.1 C) (Oral)   Ht '5\' 6"'$  (1.676 m)   Wt 160 lb (72.6 kg)   SpO2 96%   BMI 25.82 kg/m  Wt Readings from Last 3  Encounters:  03/18/18 160 lb (72.6 kg)  01/28/18 164 lb 3.2 oz (74.5 kg)  08/12/17 169 lb 12.8 oz (77 kg)    Physical Exam  Constitutional: He is oriented to person, place, and time. He appears well-developed and well-nourished. He is cooperative.  HENT:  Head: Normocephalic and atraumatic.  Right Ear: Hearing, tympanic membrane, external ear and ear canal normal.  Left Ear: Hearing, tympanic membrane, external ear and ear canal normal.  Nose: Mucosal edema present. No rhinorrhea.  Mouth/Throat: Uvula is midline,  oropharynx is clear and moist and mucous membranes are normal. Abnormal dentition. Tonsils are 1+ on the right. Tonsils are 1+ on the left. No tonsillar exudate.  Eyes: Pupils are equal, round, and reactive to light. Conjunctivae, EOM and lids are normal. No scleral icterus.  Neck: Normal range of motion. Neck supple. No tracheal deviation present. No thyromegaly present.  Cardiovascular: Normal rate, regular rhythm, normal heart sounds and intact distal pulses. Exam reveals no gallop and no friction rub.  No murmur heard. Pulmonary/Chest: Effort normal and breath sounds normal. No tachypnea. No respiratory distress. He has no decreased breath sounds. He has no wheezes. He has no rhonchi. He has no rales. He exhibits no mass and no tenderness. Right breast exhibits no inverted nipple, no mass, no nipple discharge, no skin change and no tenderness. Left breast exhibits no inverted nipple, no mass, no nipple discharge, no skin change and no tenderness.  Abdominal: Soft. Bowel sounds are normal. He exhibits no distension and no mass. There is no tenderness. There is no rebound and no guarding. Hernia confirmed negative in the right inguinal area and confirmed negative in the left inguinal area.  Genitourinary: Testes normal and penis normal. Right testis shows no mass, no swelling and no tenderness. Right testis is descended. Cremasteric reflex is not absent on the right side. Left testis shows no mass, no swelling and no tenderness. Left testis is descended. Cremasteric reflex is not absent on the left side. Circumcised.  Musculoskeletal: Normal range of motion. He exhibits deformity (slight deformity of right wrist from previous surgery). He exhibits no edema.       Right wrist: He exhibits tenderness and swelling.  Lymphadenopathy:    He has no cervical adenopathy. No inguinal adenopathy noted on the right or left side.       Right: No inguinal adenopathy present.       Left: No inguinal adenopathy  present.  Neurological: He is alert and oriented to person, place, and time. He displays normal reflexes. No cranial nerve deficit. He exhibits normal muscle tone. Coordination normal.  Skin: Skin is warm and dry. Capillary refill takes less than 2 seconds. No erythema.  Psychiatric: He has a normal mood and affect. His behavior is normal. Judgment and thought content normal.  Nursing note and vitals reviewed.     Patient has been counseled extensively about nutrition and exercise as well as the importance of adherence with medications and regular follow-up. The patient was given clear instructions to go to ER or return to medical center if symptoms don't improve, worsen or new problems develop. The patient verbalized understanding.   Follow-up: Return if symptoms worsen or fail to improve.   Gildardo Pounds, FNP-BC [facility] and [place], [place]   03/18/2018, 5:31 PM

## 2018-03-18 NOTE — Patient Instructions (Signed)

## 2018-03-19 LAB — CMP14+EGFR
A/G RATIO: 2 (ref 1.2–2.2)
ALBUMIN: 5 g/dL (ref 3.5–5.5)
ALT: 11 IU/L (ref 0–44)
AST: 16 IU/L (ref 0–40)
Alkaline Phosphatase: 86 IU/L (ref 39–117)
BILIRUBIN TOTAL: 0.7 mg/dL (ref 0.0–1.2)
BUN / CREAT RATIO: 10 (ref 9–20)
BUN: 12 mg/dL (ref 6–20)
CHLORIDE: 101 mmol/L (ref 96–106)
CO2: 25 mmol/L (ref 20–29)
Calcium: 10.1 mg/dL (ref 8.7–10.2)
Creatinine, Ser: 1.22 mg/dL (ref 0.76–1.27)
GFR, EST AFRICAN AMERICAN: 93 mL/min/{1.73_m2} (ref >59)
GFR, EST NON AFRICAN AMERICAN: 81 mL/min/{1.73_m2} (ref >59)
Globulin, Total: 2.5 g/dL (ref 1.5–4.5)
Glucose: 78 mg/dL (ref 65–99)
POTASSIUM: 4.4 mmol/L (ref 3.5–5.2)
Sodium: 141 mmol/L (ref 134–144)
TOTAL PROTEIN: 7.5 g/dL (ref 6.0–8.5)

## 2018-03-19 LAB — CBC
HEMOGLOBIN: 16.3 g/dL (ref 13.0–17.7)
Hematocrit: 47.8 % (ref 37.5–51.0)
MCH: 30.5 pg (ref 26.6–33.0)
MCHC: 34.1 g/dL (ref 31.5–35.7)
MCV: 90 fL (ref 79–97)
Platelets: 264 10*3/uL (ref 150–450)
RBC: 5.34 x10E6/uL (ref 4.14–5.80)
RDW: 13.1 % (ref 12.3–15.4)
WBC: 8.3 10*3/uL (ref 3.4–10.8)

## 2018-06-24 ENCOUNTER — Ambulatory Visit (HOSPITAL_COMMUNITY)
Admission: EM | Admit: 2018-06-24 | Discharge: 2018-06-24 | Discharge status: Against Medical Advice | Payer: Managed Care, Other (non HMO)

## 2018-06-24 NOTE — ED Triage Notes (Signed)
Called x3 in the lobby for triage. No answer.

## 2018-06-25 ENCOUNTER — Encounter (HOSPITAL_COMMUNITY): Payer: Self-pay | Admitting: Emergency Medicine

## 2018-06-25 ENCOUNTER — Ambulatory Visit (HOSPITAL_COMMUNITY)
Admission: EM | Admit: 2018-06-25 | Discharge: 2018-06-25 | Disposition: A | Discharge status: Home / Self Care | Payer: Self-pay | Attending: Family Medicine | Admitting: Family Medicine

## 2018-06-25 DIAGNOSIS — J069 Acute upper respiratory infection, unspecified: Secondary | ICD-10-CM

## 2018-06-25 DIAGNOSIS — B9789 Other viral agents as the cause of diseases classified elsewhere: Secondary | ICD-10-CM

## 2018-06-25 DIAGNOSIS — K219 Gastro-esophageal reflux disease without esophagitis: Secondary | ICD-10-CM

## 2018-06-25 NOTE — ED Provider Notes (Signed)
[facility]    CSN: [id number] Arrival date & time: 06/25/18  8250     History   Chief Complaint Chief Complaint  Patient presents with  . URI    HPI Christopher Hicks is a 28 y.o. male.   28 year old male presents with body aches, cough, low grade fever, slight nasal congestion for the past 2 to 3 days. Also having decreased appetite and diarrhea but denies any nausea or vomiting. Was feeling better today but then started sweating and feeling more achy when on his way to work. Wife has been sick earlier this week with similar symptoms. Has taken Elderberry and Tylenol with some relief. No other chronic health issues except occasional back pain and takes a muscle relaxer as needed. He also mentioned that he use to take Zantac for heartburn/mild acid reflux but medication has been recalled- asking for OTC recommendations to help treat symptoms.   The history is provided by the patient.    History reviewed. No pertinent past medical history.  Patient Active Problem List   Diagnosis Date Noted  . Left wrist tendinitis 10/22/2016  . Blood in stool 10/22/2016  . Seasonal allergic rhinitis due to pollen 10/22/2016  . Plantar fasciitis, bilateral 01/25/2016  . Current smoker 01/25/2016    Past Surgical History:  Procedure Laterality Date  . APPENDECTOMY  05/2005  . arm surgery Right 05/2004   distal forearm fracture        Home Medications    Prior to Admission medications   Medication Sig Start Date End Date Taking? Authorizing Provider  acetaminophen (TYLENOL) 500 MG tablet Take 1,500 mg by mouth every 6 (six) hours as needed. For headache    [provider]    Family History Family History  Problem Relation Age of Onset  . Diabetes Other   . Hypertension Maternal Grandmother     Social History Social History   Tobacco Use  . Smoking status: Former Smoker    Types: Cigarettes  . Smokeless tobacco: Never Used  . Tobacco comment: Quit 9  month ago.   Substance Use Topics  . Alcohol use: Yes  . Drug use: Yes    Types: Marijuana     Allergies   Patient has no known allergies.   Review of Systems Review of Systems  Constitutional: Positive for appetite change, chills, diaphoresis, fatigue and fever. Negative for activity change.  HENT: Positive for congestion and postnasal drip. Negative for ear discharge, ear pain, facial swelling, mouth sores, nosebleeds, rhinorrhea, sinus pressure, sinus pain, sneezing, sore throat and trouble swallowing.   Eyes: Negative for pain, discharge, redness and itching.  Respiratory: Positive for cough. Negative for chest tightness, shortness of breath and wheezing.   Cardiovascular: Negative for chest pain.  Gastrointestinal: Positive for diarrhea. Negative for abdominal pain, nausea and vomiting.  Musculoskeletal: Positive for arthralgias and myalgias. Negative for neck pain and neck stiffness.  Skin: Negative for color change, rash and wound.  Neurological: Positive for headaches. Negative for dizziness, tremors, seizures, syncope, weakness, light-headedness and numbness.  Hematological: Negative for adenopathy. Does not bruise/bleed easily.  Psychiatric/Behavioral: Negative.      Physical Exam Triage Vital Signs ED Triage Vitals  Enc Vitals Group     BP 06/25/18 1018 132/80     Pulse Rate 06/25/18 1018 73     Resp 06/25/18 1018 20     Temp 06/25/18 1018 98.8 F (37.1 C)     Temp Source 06/25/18 1018 Oral  SpO2 06/25/18 1018 100 %     Weight --      Height --      Head Circumference --      Peak Flow --      Pain Score 06/25/18 1019 5     Pain Loc --      Pain Edu? --      Excl. in GC? --    No data found.  Updated Vital Signs BP 132/80 (BP Location: Right Arm)   Pulse 73   Temp 98.8 F (37.1 C) (Oral)   Resp 20   SpO2 100%   Visual Acuity Right Eye Distance:   Left Eye Distance:   Bilateral Distance:    Right Eye Near:   Left Eye Near:    Bilateral  Near:     Physical Exam Vitals signs and nursing note reviewed.  Constitutional:      General: He is awake. He is not in acute distress.    Appearance: Normal appearance. He is well-developed, well-groomed and normal weight. He is diaphoretic. He is not ill-appearing.     Comments: Patient had one episode of sweats while in exam room but then resolved. He appears in no acute distress.   HENT:     Head: Normocephalic and atraumatic.     Right Ear: Hearing, tympanic membrane, ear canal and external ear normal.     Left Ear: Hearing, tympanic membrane, ear canal and external ear normal.     Nose: Nose normal.     Right Sinus: No maxillary sinus tenderness or frontal sinus tenderness.     Left Sinus: No maxillary sinus tenderness or frontal sinus tenderness.     Mouth/Throat:     Lips: Pink.     Mouth: Mucous membranes are moist.     Pharynx: Oropharynx is clear. Uvula midline. No pharyngeal swelling, oropharyngeal exudate or posterior oropharyngeal erythema.  Eyes:     Extraocular Movements: Extraocular movements intact.     Conjunctiva/sclera: Conjunctivae normal.  Neck:     Musculoskeletal: Normal range of motion and neck supple. No neck rigidity.  Cardiovascular:     Rate and Rhythm: Normal rate and regular rhythm.     Heart sounds: Normal heart sounds. No murmur.  Pulmonary:     Effort: Pulmonary effort is normal. No respiratory distress.     Breath sounds: Normal breath sounds and air entry. No decreased breath sounds, wheezing, rhonchi or rales.  Musculoskeletal: Normal range of motion.  Lymphadenopathy:     Cervical: No cervical adenopathy.  Skin:    General: Skin is warm.     Capillary Refill: Capillary refill takes less than 2 seconds.     Findings: No rash.  Neurological:     General: No focal deficit present.     Mental Status: He is alert and oriented to person, place, and time.  Psychiatric:        Mood and Affect: Mood normal.        Behavior: Behavior normal.  Behavior is cooperative.        Thought Content: Thought content normal.        Judgment: Judgment normal.      UC Treatments / Results  Labs (all labs ordered are listed, but only abnormal results are displayed) Labs Reviewed - No data to display  EKG None  Radiology No results found.  Procedures Procedures (including critical care time)  Medications Ordered in UC Medications - No data to display  Initial Impression /  Assessment and Plan / UC Course  I have reviewed the triage vital signs and the nursing notes.  Pertinent labs & imaging results that were available during my care of the patient were reviewed by me and considered in my medical decision making (see chart for details).    Discussed with patient that he probably has a viral upper respiratory illness similar to influenza. Since he is feeling better with occasional chills and sweats, recommend continue Tylenol 1000mg  every 8 hours as needed. Continue to increase fluids to maintain hydration. Discussed that he may trial OTC Pepcid AC daily as needed for heartburn. Note written for work. Recommend follow-up with his PCP in 4 to 5 days if not improving.   Final Clinical Impressions(s) / UC Diagnoses   Final diagnoses:  Viral URI with cough  Mild acid reflux     Discharge Instructions     Recommend continue Tylenol 1000mg  every 8 hours as needed for fever and body aches. Continue increasing fluids to stay well hydrated. May trial OTC Pepcid AC for heartburn/acid reflux. Follow-up with your PCP in 4 to 5 days if not improving.     ED Prescriptions    None     Controlled Substance Prescriptions [place] Controlled Substance Registry consulted? Not Applicable   Sudie Grumblingmyot, Allanah Mcfarland Berry, NP 06/25/18 1657

## 2018-06-25 NOTE — ED Triage Notes (Signed)
PT C/O: cold like sx onset 3 days associated w/body aches, fevers, diaphoresis, decreased appetite, cough, diarrhea  DENIES: vomiting  TAKING MEDS: OTC cold meds.   A&O x4... NAD... Ambulatory

## 2018-06-25 NOTE — Discharge Instructions (Addendum)
Recommend continue Tylenol 1000mg  every 8 hours as needed for fever and body aches. Continue increasing fluids to stay well hydrated. May trial OTC Pepcid AC for heartburn/acid reflux. Follow-up with your PCP in 4 to 5 days if not improving.

## 2019-09-07 ENCOUNTER — Encounter: Payer: Self-pay | Admitting: Nurse Practitioner

## 2020-08-14 ENCOUNTER — Emergency Department (HOSPITAL_COMMUNITY): Payer: BLUE CROSS/BLUE SHIELD

## 2020-08-14 ENCOUNTER — Emergency Department (HOSPITAL_COMMUNITY)
Admission: EM | Admit: 2020-08-14 | Discharge: 2020-08-14 | Discharge status: Against Medical Advice | Payer: BLUE CROSS/BLUE SHIELD | Attending: Emergency Medicine | Admitting: Emergency Medicine

## 2020-08-14 ENCOUNTER — Other Ambulatory Visit: Payer: Self-pay

## 2020-08-14 DIAGNOSIS — Y999 Unspecified external cause status: Secondary | ICD-10-CM | POA: Diagnosis not present

## 2020-08-14 DIAGNOSIS — R0789 Other chest pain: Secondary | ICD-10-CM | POA: Diagnosis not present

## 2020-08-14 DIAGNOSIS — M25522 Pain in left elbow: Secondary | ICD-10-CM | POA: Diagnosis not present

## 2020-08-14 DIAGNOSIS — M542 Cervicalgia: Secondary | ICD-10-CM | POA: Diagnosis not present

## 2020-08-14 DIAGNOSIS — M25521 Pain in right elbow: Secondary | ICD-10-CM | POA: Insufficient documentation

## 2020-08-14 DIAGNOSIS — Y92410 Unspecified street and highway as the place of occurrence of the external cause: Secondary | ICD-10-CM | POA: Diagnosis not present

## 2020-08-14 DIAGNOSIS — R519 Headache, unspecified: Secondary | ICD-10-CM | POA: Diagnosis not present

## 2020-08-14 DIAGNOSIS — Y9389 Activity, other specified: Secondary | ICD-10-CM | POA: Insufficient documentation

## 2020-08-14 DIAGNOSIS — M25421 Effusion, right elbow: Secondary | ICD-10-CM | POA: Diagnosis not present

## 2020-08-14 DIAGNOSIS — Z041 Encounter for examination and observation following transport accident: Secondary | ICD-10-CM | POA: Diagnosis not present

## 2020-08-14 DIAGNOSIS — S0990XA Unspecified injury of head, initial encounter: Secondary | ICD-10-CM | POA: Diagnosis not present

## 2020-08-14 MED ORDER — HYDROCODONE-ACETAMINOPHEN 5-325 MG PO TABS: 1.0000 | ORAL_TABLET | Freq: Once | ORAL | Status: DC

## 2020-08-14 MED ORDER — OXYCODONE-ACETAMINOPHEN 5-325 MG PO TABS
1.0000 | ORAL_TABLET | Freq: Once | ORAL | Status: AC
  Administered 2020-08-14: 1 via ORAL
  Filled 2020-08-14: qty 1
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
## 2020-08-14 NOTE — ED Notes (Signed)
Pt decided to leave due to wait time after being advised to stay and seek care.  

## 2020-08-15 ENCOUNTER — Ambulatory Visit (INDEPENDENT_AMBULATORY_CARE_PROVIDER_SITE_OTHER): Payer: BLUE CROSS/BLUE SHIELD

## 2020-08-15 ENCOUNTER — Ambulatory Visit (HOSPITAL_COMMUNITY)
Admission: EM | Admit: 2020-08-15 | Discharge: 2020-08-15 | Disposition: A | Discharge status: Home / Self Care | Payer: BLUE CROSS/BLUE SHIELD | Attending: Emergency Medicine | Admitting: Emergency Medicine

## 2020-08-15 ENCOUNTER — Encounter (HOSPITAL_COMMUNITY): Payer: Self-pay

## 2020-08-15 DIAGNOSIS — R0602 Shortness of breath: Secondary | ICD-10-CM | POA: Diagnosis not present

## 2020-08-15 DIAGNOSIS — S2232XA Fracture of one rib, left side, initial encounter for closed fracture: Secondary | ICD-10-CM

## 2020-08-15 DIAGNOSIS — M7918 Myalgia, other site: Secondary | ICD-10-CM | POA: Diagnosis not present

## 2020-08-15 DIAGNOSIS — R0781 Pleurodynia: Secondary | ICD-10-CM | POA: Diagnosis not present

## 2020-08-15 MED ORDER — IBUPROFEN 800 MG PO TABS: 800.0000 mg | ORAL_TABLET | Freq: Three times a day (TID) | ORAL | 0 refills | Status: DC | PRN

## 2020-08-15 MED ORDER — METHOCARBAMOL 500 MG PO TABS: 500.0000 mg | ORAL_TABLET | Freq: Two times a day (BID) | ORAL | 0 refills | Status: DC | PRN
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
## 2020-08-15 NOTE — Discharge Instructions (Signed)
Take the ibuprofen as needed for discomfort.  Take the muscle relaxer as needed for muscle spasm; Do not drive, operate machinery, or drink alcohol with this medication as it can cause drowsiness.   Follow up with your primary care provider.    Go to the emergency department if you have acute shortness of breath or other concerning symptoms.
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
## 2020-08-23 DIAGNOSIS — Z13220 Encounter for screening for lipoid disorders: Secondary | ICD-10-CM | POA: Diagnosis not present

## 2020-08-23 DIAGNOSIS — Z0000 Encounter for general adult medical examination without abnormal findings: Secondary | ICD-10-CM | POA: Diagnosis not present

## 2020-08-28 DIAGNOSIS — Z20822 Contact with and (suspected) exposure to covid-19: Secondary | ICD-10-CM | POA: Diagnosis not present

## 2020-09-20 ENCOUNTER — Encounter: Payer: Self-pay | Admitting: Nurse Practitioner

## 2020-10-02 DIAGNOSIS — J4520 Mild intermittent asthma, uncomplicated: Secondary | ICD-10-CM | POA: Diagnosis not present

## 2020-10-02 DIAGNOSIS — S2232XD Fracture of one rib, left side, subsequent encounter for fracture with routine healing: Secondary | ICD-10-CM | POA: Diagnosis not present

## 2020-10-02 DIAGNOSIS — R000 Tachycardia, unspecified: Secondary | ICD-10-CM | POA: Diagnosis not present

## 2020-10-02 DIAGNOSIS — J302 Other seasonal allergic rhinitis: Secondary | ICD-10-CM | POA: Diagnosis not present

## 2021-09-07 ENCOUNTER — Ambulatory Visit (HOSPITAL_COMMUNITY)
Admission: EM | Admit: 2021-09-07 | Discharge: 2021-09-07 | Disposition: A | Discharge status: Home / Self Care | Payer: Commercial Managed Care - PPO | Attending: Family Medicine | Admitting: Family Medicine

## 2021-09-07 DIAGNOSIS — N5089 Other specified disorders of the male genital organs: Secondary | ICD-10-CM | POA: Diagnosis present

## 2021-09-07 MED ORDER — DOXYCYCLINE HYCLATE 100 MG PO CAPS: 100.0000 mg | ORAL_CAPSULE | Freq: Two times a day (BID) | ORAL | 0 refills | Status: AC

## 2021-09-07 NOTE — Discharge Instructions (Addendum)
Staff will call you if any tests are positive ? ?Since it sounds like the spot drained some pus, I want to take doxycycline 100 mg--1 capsule twice daily for 7 days ?
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
## 2021-09-07 NOTE — ED Triage Notes (Signed)
Pt was shaving 2 days ago and noticed a piece of skin fall off his penis.  ?

## 2021-09-08 LAB — SYPHILIS: RPR W/REFLEX TO RPR TITER AND TREPONEMAL ANTIBODIES, TRADITIONAL SCREENING AND DIAGNOSIS ALGORITHM: RPR Ser Ql: NONREACTIVE

## 2021-09-11 LAB — CYTOLOGY, (ORAL, ANAL, URETHRAL) ANCILLARY ONLY
Chlamydia: NEGATIVE
Comment: NEGATIVE
Comment: NEGATIVE
Comment: NORMAL
Neisseria Gonorrhea: NEGATIVE
Trichomonas: NEGATIVE
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
## 2023-07-19 ENCOUNTER — Emergency Department (HOSPITAL_COMMUNITY): Payer: Self-pay

## 2023-07-19 ENCOUNTER — Emergency Department (HOSPITAL_COMMUNITY)
Admission: EM | Admit: 2023-07-19 | Discharge: 2023-07-20 | Discharge status: Against Medical Advice | Payer: Self-pay | Attending: Emergency Medicine | Admitting: Emergency Medicine

## 2023-07-19 ENCOUNTER — Other Ambulatory Visit: Payer: Self-pay

## 2023-07-19 ENCOUNTER — Encounter (HOSPITAL_COMMUNITY): Payer: Self-pay | Admitting: Emergency Medicine

## 2023-07-19 DIAGNOSIS — Z5321 Procedure and treatment not carried out due to patient leaving prior to being seen by health care provider: Secondary | ICD-10-CM | POA: Insufficient documentation

## 2023-07-19 DIAGNOSIS — W1830XA Fall on same level, unspecified, initial encounter: Secondary | ICD-10-CM | POA: Insufficient documentation

## 2023-07-19 DIAGNOSIS — Y9321 Activity, ice skating: Secondary | ICD-10-CM | POA: Insufficient documentation

## 2023-07-19 DIAGNOSIS — S99912A Unspecified injury of left ankle, initial encounter: Secondary | ICD-10-CM | POA: Insufficient documentation
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
## 2023-07-20 MED ORDER — ACETAMINOPHEN 325 MG PO TABS
650.0000 mg | ORAL_TABLET | Freq: Once | ORAL | Status: AC
  Administered 2023-07-20: 650 mg via ORAL
  Filled 2023-07-20: qty 2

## 2023-07-20 NOTE — ED Notes (Signed)
 Pt decided to leave before going to a room.

## 2024-04-13 ENCOUNTER — Other Ambulatory Visit: Payer: Self-pay

## 2024-04-13 ENCOUNTER — Emergency Department (HOSPITAL_BASED_OUTPATIENT_CLINIC_OR_DEPARTMENT_OTHER)
Admission: EM | Admit: 2024-04-13 | Discharge: 2024-04-13 | Disposition: A | Discharge status: Home / Self Care | Attending: Emergency Medicine | Admitting: Emergency Medicine

## 2024-04-13 ENCOUNTER — Emergency Department (HOSPITAL_BASED_OUTPATIENT_CLINIC_OR_DEPARTMENT_OTHER): Admitting: Radiology

## 2024-04-13 ENCOUNTER — Encounter (HOSPITAL_BASED_OUTPATIENT_CLINIC_OR_DEPARTMENT_OTHER): Payer: Self-pay | Admitting: Emergency Medicine

## 2024-04-13 DIAGNOSIS — R0789 Other chest pain: Secondary | ICD-10-CM | POA: Insufficient documentation

## 2024-04-13 DIAGNOSIS — S60222A Contusion of left hand, initial encounter: Secondary | ICD-10-CM | POA: Diagnosis not present

## 2024-04-13 DIAGNOSIS — M542 Cervicalgia: Secondary | ICD-10-CM | POA: Insufficient documentation

## 2024-04-13 DIAGNOSIS — Y92410 Unspecified street and highway as the place of occurrence of the external cause: Secondary | ICD-10-CM | POA: Diagnosis not present

## 2024-04-13 DIAGNOSIS — S6992XA Unspecified injury of left wrist, hand and finger(s), initial encounter: Secondary | ICD-10-CM | POA: Diagnosis present

## 2024-04-13 HISTORY — DX: Unspecified asthma, uncomplicated: J45.909

## 2024-04-13 MED ORDER — IBUPROFEN 400 MG PO TABS
600.0000 mg | ORAL_TABLET | Freq: Once | ORAL | Status: AC
  Administered 2024-04-13: 600 mg via ORAL
  Filled 2024-04-13: qty 1

## 2024-04-13 MED ORDER — METHOCARBAMOL 750 MG PO TABS: 750.0000 mg | ORAL_TABLET | Freq: Three times a day (TID) | ORAL | 0 refills | Status: AC | PRN

## 2024-04-13 MED ORDER — IBUPROFEN 600 MG PO TABS: 600.0000 mg | ORAL_TABLET | Freq: Four times a day (QID) | ORAL | 0 refills | Status: AC | PRN
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
## 2024-04-13 NOTE — Discharge Instructions (Signed)
 Take medications as prescribed. Warm compresses to the sore areas may help as well. Follow up with your doctor as needed and return to the ED with any severe pain or new symptoms of concern.

## 2024-04-15 ENCOUNTER — Other Ambulatory Visit: Payer: Self-pay

## 2024-04-15 ENCOUNTER — Emergency Department (HOSPITAL_BASED_OUTPATIENT_CLINIC_OR_DEPARTMENT_OTHER): Payer: Self-pay

## 2024-04-15 ENCOUNTER — Encounter (HOSPITAL_BASED_OUTPATIENT_CLINIC_OR_DEPARTMENT_OTHER): Payer: Self-pay | Admitting: Emergency Medicine

## 2024-04-15 ENCOUNTER — Emergency Department (HOSPITAL_BASED_OUTPATIENT_CLINIC_OR_DEPARTMENT_OTHER)
Admission: EM | Admit: 2024-04-15 | Discharge: 2024-04-15 | Disposition: A | Discharge status: Home / Self Care | Payer: Self-pay | Attending: Emergency Medicine | Admitting: Emergency Medicine

## 2024-04-15 DIAGNOSIS — J45909 Unspecified asthma, uncomplicated: Secondary | ICD-10-CM | POA: Insufficient documentation

## 2024-04-15 DIAGNOSIS — K529 Noninfective gastroenteritis and colitis, unspecified: Secondary | ICD-10-CM | POA: Insufficient documentation

## 2024-04-15 DIAGNOSIS — K921 Melena: Secondary | ICD-10-CM | POA: Insufficient documentation

## 2024-04-15 LAB — COMPREHENSIVE METABOLIC PANEL WITH GFR
ALT: 13 U/L (ref 0–44)
AST: 20 U/L (ref 15–41)
Albumin: 4.4 g/dL (ref 3.5–5.0)
Alkaline Phosphatase: 85 U/L (ref 38–126)
Anion gap: 8 (ref 5–15)
BUN: 12 mg/dL (ref 6–20)
CO2: 26 mmol/L (ref 22–32)
Calcium: 9.6 mg/dL (ref 8.9–10.3)
Chloride: 107 mmol/L (ref 98–111)
Creatinine, Ser: 1.17 mg/dL (ref 0.61–1.24)
GFR, Estimated: >60 mL/min (ref >60)
Glucose, Bld: 111 mg/dL — ABNORMAL HIGH (ref 70–99)
Potassium: 4 mmol/L (ref 3.5–5.1)
Sodium: 140 mmol/L (ref 135–145)
Total Bilirubin: 0.6 mg/dL (ref 0.0–1.2)
Total Protein: 6.7 g/dL (ref 6.5–8.1)

## 2024-04-15 LAB — CBC WITH DIFFERENTIAL/PLATELET
Abs Immature Granulocytes: 0.01 K/uL (ref 0.00–0.07)
Basophils Absolute: 0 K/uL (ref 0.0–0.1)
Basophils Relative: 0 %
Eosinophils Absolute: 0.2 K/uL (ref 0.0–0.5)
Eosinophils Relative: 2 %
HCT: 40.4 % (ref 39.0–52.0)
Hemoglobin: 14.1 g/dL (ref 13.0–17.0)
Immature Granulocytes: 0 %
Lymphocytes Relative: 23 %
Lymphs Abs: 1.6 K/uL (ref 0.7–4.0)
MCH: 31.6 pg (ref 26.0–34.0)
MCHC: 34.9 g/dL (ref 30.0–36.0)
MCV: 90.6 fL (ref 80.0–100.0)
Monocytes Absolute: 0.4 K/uL (ref 0.1–1.0)
Monocytes Relative: 5 %
Neutro Abs: 5 K/uL (ref 1.7–7.7)
Neutrophils Relative %: 70 %
Platelets: 225 K/uL (ref 150–400)
RBC: 4.46 MIL/uL (ref 4.22–5.81)
RDW: 12.9 % (ref 11.5–15.5)
WBC: 7.2 K/uL (ref 4.0–10.5)
nRBC: 0 % (ref 0.0–0.2)

## 2024-04-15 MED ORDER — IOHEXOL 300 MG/ML  SOLN
100.0000 mL | Freq: Once | INTRAMUSCULAR | Status: AC | PRN
  Administered 2024-04-15: 100 mL via INTRAVENOUS

## 2024-04-15 MED ORDER — AMOXICILLIN-POT CLAVULANATE 875-125 MG PO TABS: 1.0000 | ORAL_TABLET | Freq: Two times a day (BID) | ORAL | 0 refills | Status: AC

## 2024-04-15 MED ORDER — AMOXICILLIN-POT CLAVULANATE 875-125 MG PO TABS
1.0000 | ORAL_TABLET | Freq: Two times a day (BID) | ORAL | 0 refills | Status: DC
  Filled 2024-04-15: qty 20, 10d supply, fill #0
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
